# Patient Record
Sex: Male | Born: 1970 | Race: White | Hispanic: No | Marital: Married | Smoking: Never smoker
Health system: Southern US, Community
[De-identification: ages and names within clinical notes are randomized; demographics above are authoritative.]

## PROBLEM LIST (undated history)

## (undated) DIAGNOSIS — F32A Depression, unspecified: Secondary | ICD-10-CM

## (undated) DIAGNOSIS — E782 Mixed hyperlipidemia: Secondary | ICD-10-CM

## (undated) DIAGNOSIS — F419 Anxiety disorder, unspecified: Secondary | ICD-10-CM

## (undated) DIAGNOSIS — I10 Essential (primary) hypertension: Secondary | ICD-10-CM

## (undated) DIAGNOSIS — M545 Low back pain, unspecified: Secondary | ICD-10-CM

## (undated) DIAGNOSIS — F411 Generalized anxiety disorder: Secondary | ICD-10-CM

## (undated) DIAGNOSIS — F329 Major depressive disorder, single episode, unspecified: Secondary | ICD-10-CM

## (undated) DIAGNOSIS — F410 Panic disorder [episodic paroxysmal anxiety] without agoraphobia: Secondary | ICD-10-CM

## (undated) HISTORY — DX: Depression, unspecified: F32.A

## (undated) HISTORY — DX: Major depressive disorder, single episode, unspecified: F32.9

## (undated) HISTORY — DX: Panic disorder (episodic paroxysmal anxiety): F41.0

## (undated) HISTORY — DX: Low back pain: M54.5

## (undated) HISTORY — DX: Generalized anxiety disorder: F41.1

## (undated) HISTORY — DX: Anxiety disorder, unspecified: F41.9

## (undated) HISTORY — DX: Low back pain, unspecified: M54.50

## (undated) HISTORY — DX: Mixed hyperlipidemia: E78.2

---

## 2004-12-26 ENCOUNTER — Ambulatory Visit: Payer: Self-pay | Admitting: Family Medicine

## 2005-06-02 ENCOUNTER — Ambulatory Visit: Payer: Self-pay | Admitting: Family Medicine

## 2005-06-10 ENCOUNTER — Emergency Department (HOSPITAL_COMMUNITY): Admission: EM | Admit: 2005-06-10 | Discharge: 2005-06-10 | Payer: Self-pay | Admitting: Family Medicine

## 2005-07-22 ENCOUNTER — Ambulatory Visit: Payer: Self-pay | Admitting: Family Medicine

## 2005-08-23 ENCOUNTER — Emergency Department (HOSPITAL_COMMUNITY): Admission: EM | Admit: 2005-08-23 | Discharge: 2005-08-23 | Payer: Self-pay | Admitting: Family Medicine

## 2006-01-03 ENCOUNTER — Emergency Department (HOSPITAL_COMMUNITY): Admission: EM | Admit: 2006-01-03 | Discharge: 2006-01-03 | Payer: Self-pay | Admitting: Family Medicine

## 2006-08-16 ENCOUNTER — Emergency Department (HOSPITAL_COMMUNITY): Admission: EM | Admit: 2006-08-16 | Discharge: 2006-08-16 | Payer: Self-pay | Admitting: Emergency Medicine

## 2011-09-07 ENCOUNTER — Emergency Department (HOSPITAL_COMMUNITY)
Admission: EM | Admit: 2011-09-07 | Discharge: 2011-09-08 | Disposition: A | Discharge status: Home / Self Care | Payer: Managed Care, Other (non HMO) | Attending: Internal Medicine | Admitting: Internal Medicine

## 2011-09-07 ENCOUNTER — Encounter (HOSPITAL_COMMUNITY): Payer: Self-pay | Admitting: *Deleted

## 2011-09-07 DIAGNOSIS — F1120 Opioid dependence, uncomplicated: Secondary | ICD-10-CM | POA: Insufficient documentation

## 2011-09-07 LAB — CBC
HCT: 41 % (ref 39.0–52.0)
Hemoglobin: 14.6 g/dL (ref 13.0–17.0)
MCH: 30.7 pg (ref 26.0–34.0)
MCHC: 35.6 g/dL (ref 30.0–36.0)
Platelets: 343 10*3/uL (ref 150–400)
RBC: 4.75 MIL/uL (ref 4.22–5.81)

## 2011-09-07 LAB — COMPREHENSIVE METABOLIC PANEL
AST: 30 U/L (ref 0–37)
Albumin: 4.2 g/dL (ref 3.5–5.2)
BUN: 13 mg/dL (ref 6–23)
CO2: 24 mEq/L (ref 19–32)
Calcium: 9.2 mg/dL (ref 8.4–10.5)
Chloride: 102 mEq/L (ref 96–112)
GFR calc Af Amer: >90 mL/min (ref >90)
GFR calc non Af Amer: >90 mL/min (ref >90)
Glucose, Bld: 95 mg/dL (ref 70–99)
Potassium: 3.6 mEq/L (ref 3.5–5.1)
Sodium: 137 mEq/L (ref 135–145)
Total Bilirubin: 0.3 mg/dL (ref 0.3–1.2)
Total Protein: 7.7 g/dL (ref 6.0–8.3)

## 2011-09-07 LAB — RAPID URINE DRUG SCREEN, HOSP PERFORMED
Amphetamines: NOT DETECTED
Benzodiazepines: NOT DETECTED
Cocaine: NOT DETECTED
Opiates: POSITIVE — AB
Tetrahydrocannabinol: NOT DETECTED

## 2011-09-07 LAB — DIFFERENTIAL
Basophils Absolute: 0 10*3/uL (ref 0.0–0.1)
Basophils Relative: 0 % (ref 0–1)
Eosinophils Absolute: 0.1 10*3/uL (ref 0.0–0.7)
Eosinophils Relative: 1 % (ref 0–5)
Lymphs Abs: 1.7 10*3/uL (ref 0.7–4.0)
Monocytes Absolute: 0.9 10*3/uL (ref 0.1–1.0)
Monocytes Relative: 8 % (ref 3–12)
Neutro Abs: 8.1 10*3/uL — ABNORMAL HIGH (ref 1.7–7.7)
Neutrophils Relative %: 75 % (ref 43–77)

## 2011-09-07 MED ORDER — NICOTINE 21 MG/24HR TD PT24: 21.0000 mg | MEDICATED_PATCH | Freq: Every day | TRANSDERMAL | Status: DC

## 2011-09-07 MED ORDER — ZOLPIDEM TARTRATE 5 MG PO TABS
10.0000 mg | ORAL_TABLET | Freq: Every evening | ORAL | Status: DC | PRN
  Administered 2011-09-08: 10 mg via ORAL
  Filled 2011-09-07: qty 2

## 2011-09-07 MED ORDER — ONDANSETRON 4 MG PO TBDP
ORAL_TABLET | ORAL | Status: AC
  Administered 2011-09-07: 4 mg
  Filled 2011-09-07: qty 1

## 2011-09-07 MED ORDER — ONDANSETRON HCL 4 MG PO TABS
4.0000 mg | ORAL_TABLET | Freq: Three times a day (TID) | ORAL | Status: DC | PRN
  Administered 2011-09-08: 4 mg via ORAL
  Filled 2011-09-07: qty 1

## 2011-09-07 MED ORDER — ACETAMINOPHEN 325 MG PO TABS
650.0000 mg | ORAL_TABLET | ORAL | Status: DC | PRN
  Administered 2011-09-07: 650 mg via ORAL
  Filled 2011-09-07: qty 2

## 2011-09-07 MED ORDER — LORAZEPAM 1 MG PO TABS
1.0000 mg | ORAL_TABLET | ORAL | Status: DC | PRN
  Administered 2011-09-07 – 2011-09-08 (×4): 1 mg via ORAL
  Filled 2011-09-07 (×4): qty 1

## 2011-09-07 MED ORDER — ALUM & MAG HYDROXIDE-SIMETH 200-200-20 MG/5ML PO SUSP
30.0000 mL | ORAL | Status: DC | PRN
  Administered 2011-09-08: 30 mL via ORAL
  Filled 2011-09-07: qty 30

## 2011-09-07 MED ORDER — CLONIDINE HCL 0.1 MG PO TABS
0.1000 mg | ORAL_TABLET | Freq: Three times a day (TID) | ORAL | Status: DC
  Administered 2011-09-08 (×2): 0.1 mg via ORAL
  Filled 2011-09-07: qty 1

## 2011-09-07 MED ORDER — IBUPROFEN 200 MG PO TABS: 600.0000 mg | ORAL_TABLET | Freq: Three times a day (TID) | ORAL | Status: DC | PRN

## 2011-09-07 NOTE — ED Notes (Signed)
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 Usually takes 6 hydrocodone 10/750 daily.

## 2011-09-07 NOTE — ED Provider Notes (Signed)
History     CSN: [id number]  Arrival date & time 09/07/11  1730   First MD Initiated Contact with Patient 09/07/11 1739      Chief Complaint  Patient presents with  . Drug Problem    (Consider location/radiation/quality/duration/timing/severity/associated sxs/prior treatment) HPI  Patient relates he has been addicted to opiates for a few years. He states he quit on his own about a year ago and was sober about 2 months and restarted taking narcotics off the street. He states he's taking about 6 hydrocodone 10/750 a day. He states he took his last dose yesterday. He states today he is having myalgias, weakness, some headache, chills, and mild nausea. He states he has mild depression but he denies feeling suicidal or homicidal. He states he's never been formally admitted for detox or rehabilitation.  PCP none  History reviewed. No pertinent past medical history.  History reviewed. No pertinent past surgical history.  No family history on file.  History  Substance Use Topics  . Smoking status: Never Smoker   . Smokeless tobacco: Not on file  . Alcohol Use: No   employed Lives with spouse    Review of Systems  All other systems reviewed and are negative.    Allergies  Suboxone and Subutex  Home Medications  No current outpatient prescriptions on file.  BP 140/94  Pulse 107  Temp(Src) 98.2 F (36.8 C) (Oral)  Resp 19  Wt 195 lb (88.451 kg)  SpO2 100% Vital signs normal except for tachycardia   Physical Exam  Nursing note and vitals reviewed. Constitutional: He is oriented to person, place, and time. He appears well-developed and well-nourished.  Non-toxic appearance. He does not appear ill. No distress.  HENT:  Head: Normocephalic and atraumatic.  Right Ear: External ear normal.  Left Ear: External ear normal.  Nose: Nose normal. No mucosal edema or rhinorrhea.  Mouth/Throat: Oropharynx is clear and moist and mucous membranes are normal. No dental abscesses  or uvula swelling.  Eyes: Conjunctivae and EOM are normal. Pupils are equal, round, and reactive to light.  Neck: Normal range of motion and full passive range of motion without pain. Neck supple.  Cardiovascular: Normal rate, regular rhythm and normal heart sounds.  Exam reveals no gallop and no friction rub.   No murmur heard. Pulmonary/Chest: Effort normal and breath sounds normal. No respiratory distress. He has no wheezes. He has no rhonchi. He has no rales. He exhibits no tenderness and no crepitus.  Abdominal: Soft. Normal appearance and bowel sounds are normal. He exhibits no distension. There is no tenderness. There is no rebound and no guarding.  Musculoskeletal: Normal range of motion. He exhibits no edema and no tenderness.       Moves all extremities well.   Neurological: He is alert and oriented to person, place, and time. He has normal strength. No cranial nerve deficit.  Skin: Skin is warm, dry and intact. No rash noted. No erythema. No pallor.  Psychiatric: He has a normal mood and affect. His speech is normal and behavior is normal. His mood appears not anxious.    ED Course  Procedures (including critical care time) Psych holding orders written, waiting for ACT evaluation.   Results for orders placed during the hospital encounter of 09/07/11  URINE RAPID DRUG SCREEN (HOSP PERFORMED)      Component Value Range   Opiates POSITIVE (*) NONE DETECTED    Cocaine NONE DETECTED  NONE DETECTED    Benzodiazepines NONE DETECTED  NONE DETECTED    Amphetamines NONE DETECTED  NONE DETECTED    Tetrahydrocannabinol NONE DETECTED  NONE DETECTED    Barbiturates NONE DETECTED  NONE DETECTED   CBC      Component Value Range   WBC 10.8 (*) 4.0 - 10.5 (K/uL)   RBC 4.75  4.22 - 5.81 (MIL/uL)   Hemoglobin 14.6  13.0 - 17.0 (g/dL)   HCT 13.0  86.5 - 78.4 (%)   MCV 86.3  78.0 - 100.0 (fL)   MCH 30.7  26.0 - 34.0 (pg)   MCHC 35.6  30.0 - 36.0 (g/dL)   RDW 69.6  29.5 - 28.4 (%)    Platelets 343  150 - 400 (K/uL)  DIFFERENTIAL      Component Value Range   Neutrophils Relative 75  43 - 77 (%)   Neutro Abs 8.1 (*) 1.7 - 7.7 (K/uL)   Lymphocytes Relative 15  12 - 46 (%)   Lymphs Abs 1.7  0.7 - 4.0 (K/uL)   Monocytes Relative 8  3 - 12 (%)   Monocytes Absolute 0.9  0.1 - 1.0 (K/uL)   Eosinophils Relative 1  0 - 5 (%)   Eosinophils Absolute 0.1  0.0 - 0.7 (K/uL)   Basophils Relative 0  0 - 1 (%)   Basophils Absolute 0.0  0.0 - 0.1 (K/uL)  ETHANOL      Component Value Range   Alcohol, Ethyl (B) <11  0 - 11 (mg/dL)  COMPREHENSIVE METABOLIC PANEL      Component Value Range   Sodium 137  135 - 145 (mEq/L)   Potassium 3.6  3.5 - 5.1 (mEq/L)   Chloride 102  96 - 112 (mEq/L)   CO2 24  19 - 32 (mEq/L)   Glucose, Bld 95  70 - 99 (mg/dL)   BUN 13  6 - 23 (mg/dL)   Creatinine, Ser 1.32  0.50 - 1.35 (mg/dL)   Calcium 9.2  8.4 - 44.0 (mg/dL)   Total Protein 7.7  6.0 - 8.3 (g/dL)   Albumin 4.2  3.5 - 5.2 (g/dL)   AST 30  0 - 37 (U/L)   ALT 36  0 - 53 (U/L)   Alkaline Phosphatase 101  39 - 117 (U/L)   Total Bilirubin 0.3  0.3 - 1.2 (mg/dL)   GFR calc non Af Amer >90  >90 (mL/min)   GFR calc Af Amer >90  >90 (mL/min)   Laboratory interpretation all normal except for UDS   Diagnoses that have been ruled out:  None  Diagnoses that are still under consideration:  None  Final diagnoses:  Narcotic addiction   Plan evaluation by ACT and placement  Devoria Albe, MD, FACEP   MDM          Ward Givens, MD 09/08/11 [phone]

## 2011-09-07 NOTE — ED Notes (Signed)
Pt states "here for detox off pain meds, last dose taken was yesterday approx 1600, hydrocodone 10 mg, subaxone about killed me"

## 2011-09-07 NOTE — ED Notes (Signed)
Pt c/o headache, nausea and nervousness. Medicated per order.

## 2011-09-07 NOTE — ED Notes (Signed)
MD at bedside. 

## 2011-09-08 ENCOUNTER — Encounter (HOSPITAL_COMMUNITY): Payer: Self-pay | Admitting: *Deleted

## 2011-09-08 LAB — GLUCOSE, CAPILLARY: Glucose-Capillary: 97 mg/dL (ref 70–99)

## 2011-09-08 MED ORDER — CLONIDINE HCL 0.1 MG PO TABS: 0.1000 mg | ORAL_TABLET | Freq: Three times a day (TID) | ORAL | Status: DC | PRN

## 2011-09-08 NOTE — BH Assessment (Signed)
Patient referred and also declined at [facility]. Pt declined by Dr.Ikewechegh at [facility].  Informed by Fulton Mole in the assessment department that a bed is available and patient will be ran. Pt is under review at [facility] at this time.

## 2011-09-08 NOTE — BH Assessment (Signed)
Assessment Note   Noah Holland is a 41 y.o. male who presents to [facility] requesting detox from Opiates.  Pt is abusing opiates--hydrocodone and oxycodone,  was prescribed meds 2 yrs for kidney stones and has been abusing since that time, taking 6 pills daily.  Pt denies SI/HI/Psych and has had no prior tx for for SA.   Pt has no c/o w/d sxs but reports sxs earlier--chills, muscle aches, nausea; given meds for sxs.  Pt info sent to [facility] to review for inpt admission.    Axis I: Substance Abuse Axis II: Deferred Axis III: History reviewed. No pertinent past medical history. Axis IV: other psychosocial or environmental problems, problems related to social environment and problems with primary support group Axis V: 41-50 serious symptoms  Past Medical History: History reviewed. No pertinent past medical history.  History reviewed. No pertinent past surgical history.  Family History: No family history on file.  Social History:  reports that he has never smoked. He does not have any smokeless tobacco history on file. He reports that he uses illicit drugs (Hydrocodone and Oxycodone) about 42 times per week. He reports that he does not drink alcohol.  Additional Social History:  Alcohol / Drug Use Pain Medications: Opiates--hydrocodone, oxycodone  Prescriptions: None  Over the Counter: None  History of alcohol / drug use?: Yes Substance #1 Name of Substance 1: Opiates--Hydrocodone, Oxycodone  1 - Age of First Use: 38 YOM 1 - Amount (size/oz): 6 Pills  1 - Frequency: Daily  1 - Duration: 2 Yrs  1 - Last Use / Amount: 09/07/11 Allergies:  Allergies  Allergen Reactions  . Suboxone     Aching, vomiting, diarrhea, chills  . Subutex (Buprenorphine Hcl)     Aching, chills, vomiting    Home Medications:  Medications Prior to Admission  Medication Dose Route Frequency Provider Last Rate Last Dose  . acetaminophen (TYLENOL) tablet 650 mg  650 mg Oral Q4H PRN Ward Givens, MD   650 mg at  09/07/11 2018  . alum & mag hydroxide-simeth (MAALOX/MYLANTA) 200-200-20 MG/5ML suspension 30 mL  30 mL Oral PRN Ward Givens, MD   30 mL at 09/08/11 0019  . cloNIDine (CATAPRES) tablet 0.1 mg  0.1 mg Oral TID Ward Givens, MD      . ibuprofen (ADVIL,MOTRIN) tablet 600 mg  600 mg Oral Q8H PRN Ward Givens, MD      . LORazepam (ATIVAN) tablet 1 mg  1 mg Oral Q4H PRN Ward Givens, MD   1 mg at 09/08/11 0018  . nicotine (NICODERM CQ - dosed in mg/24 hours) patch 21 mg  21 mg Transdermal Daily Ward Givens, MD      . ondansetron (ZOFRAN) tablet 4 mg  4 mg Oral Q8H PRN Ward Givens, MD      . ondansetron (ZOFRAN-ODT) 4 MG disintegrating tablet        4 mg at 09/07/11 2020  . zolpidem (AMBIEN) tablet 10 mg  10 mg Oral QHS PRN Ward Givens, MD   10 mg at 09/08/11 0141   No current outpatient prescriptions on file as of 09/07/2011.    OB/GYN Status:  No LMP for male patient.  General Assessment Data Location of Assessment: [facility] ED Living Arrangements: Spouse/significant other Can pt return to current living arrangement?: Yes Admission Status: Voluntary Is patient capable of signing voluntary admission?: Yes Transfer from: Acute Hospital Referral Source: MD  Education Status Is patient currently in school?:  No Current Grade: None  Highest grade of school patient has completed: None  Name of school: None  Contact person: None   Risk to self Suicidal Ideation: No Suicidal Intent: No Is patient at risk for suicide?: No Suicidal Plan?: No Access to Means: No What has been your use of drugs/alcohol within the last 12 months?: Pt. currently abusing opiates  Previous Attempts/Gestures: No How many times?: 0  Other Self Harm Risks: None  Triggers for Past Attempts: None known Intentional Self Injurious Behavior: None Family Suicide History: No Recent stressful life event(s): Other (Comment) (Abusing opiates x34yrs ) Depression: No Depression Symptoms:  (None ) Substance abuse history and/or  treatment for substance abuse?: Yes Suicide prevention information given to non-admitted patients: Not applicable  Risk to Others Homicidal Ideation: No Thoughts of Harm to Others: No Current Homicidal Intent: No Current Homicidal Plan: No Access to Homicidal Means: No Identified Victim: None  History of harm to others?: No Assessment of Violence: None Noted Violent Behavior Description: None  Does patient have access to weapons?: No Criminal Charges Pending?: No Does patient have a court date: No  Psychosis Hallucinations: None noted Delusions: None noted  Mental Status Report Appear/Hygiene: Improved Eye Contact: Good Motor Activity: Unremarkable Speech: Logical/coherent Level of Consciousness: Alert Mood: Sad Affect: Sad Anxiety Level: None Thought Processes: Coherent;Relevant Judgement: Unimpaired Orientation: Person;Place;Time;Situation Obsessive Compulsive Thoughts/Behaviors: None  Cognitive Functioning Concentration: Normal Memory: Recent Intact;Remote Intact IQ: Average Insight: Fair Impulse Control: Fair Appetite: Good Weight Loss: 0  Weight Gain: 0  Sleep: No Change Total Hours of Sleep: 8  Vegetative Symptoms: None  Prior Inpatient Therapy Prior Inpatient Therapy: No Prior Therapy Dates: None  Prior Therapy Facilty/Provider(s): None  Reason for Treatment: None   Prior Outpatient Therapy Prior Outpatient Therapy: No Prior Therapy Dates: None  Prior Therapy Facilty/Provider(s): None  Reason for Treatment: None   ADL Screening (condition at time of admission) Patient's cognitive ability adequate to safely complete daily activities?: Yes Patient able to express need for assistance with ADLs?: Yes Independently performs ADLs?: Yes Weakness of Legs: None Weakness of Arms/Hands: None       Abuse/Neglect Assessment (Assessment to be complete while patient is alone) Physical Abuse: Denies Verbal Abuse: Denies Sexual Abuse:  Denies Exploitation of patient/patient's resources: Denies Self-Neglect: Denies Values / Beliefs Cultural Requests During Hospitalization: None Spiritual Requests During Hospitalization: None Consults Spiritual Care Consult Needed: No Social Work Consult Needed: No Merchant navy officer (For Healthcare) Advance Directive: Patient does not have advance directive;Patient would not like information Pre-existing out of facility DNR order (yellow form or pink MOST form): No    Additional Information 1:1 In Past 12 Months?: No CIRT Risk: No Elopement Risk: No Does patient have medical clearance?: Yes     Disposition:  Disposition Disposition of Patient: Inpatient treatment program;Referred to [facility] for review ) Type of inpatient treatment program: Adult Patient referred to: Other (Comment) [facility] for review )  On Site Evaluation by:   Reviewed with Physician:     Murrell Redden 09/08/2011 5:43 AM

## 2011-09-08 NOTE — ED Notes (Signed)
Patient is resting comfortably. 

## 2011-09-08 NOTE — ED Notes (Signed)
Pt anxious stated he thinks he is having a" panic attack"" Ativan given

## 2011-09-08 NOTE — BH Assessment (Signed)
Patient referred to [facility] . Confirmed with staff-Lynn at [facility] that they do have available beds. Writer will follow up with patients disposition.

## 2011-09-08 NOTE — ED Provider Notes (Signed)
Patient is awaiting placement for opiate detoxification. He is currently sleeping and with no complaints.  Noah Booze, MD 09/08/11 [phone]

## 2011-09-08 NOTE — Progress Notes (Signed)
ED CM approached by family to inquire about ativan and BH d/c plans.  CM spoke with Jessie Foot ACT team member to discuss inquiry about [facility] d/c plans.  Will see pt

## 2011-09-08 NOTE — ED Notes (Signed)
Pt wants to sign out AMA Dr. Alto Denver aware

## 2011-09-08 NOTE — ED Notes (Signed)
Pt brought over from ED to and placed in room 28 with family at made comfortable.

## 2011-09-08 NOTE — ED Provider Notes (Signed)
Patient no longer wanted to stay for detox. He also was turned down by [facility] which was the last facility pending. Patient did have improvement with use of clonidine. He was discharged with a prescription for this. Family was present for discharge were comfortable with plan. Patient was discharged in good condition.  Cyndra Numbers, MD 09/08/11 1949

## 2011-09-08 NOTE — ED Notes (Addendum)
Pt  Anxious c/o nausea meds given see mar

## 2011-09-08 NOTE — ED Notes (Signed)
Pt wanted to sign out AMA DR Huntt made aware

## 2011-09-08 NOTE — ED Notes (Signed)
No distress.  Family is at the bedside.  Belongings returned to pt.

## 2014-02-26 ENCOUNTER — Emergency Department (HOSPITAL_COMMUNITY)
Admission: EM | Admit: 2014-02-26 | Discharge: 2014-02-26 | Disposition: A | Discharge status: Home / Self Care | Payer: Managed Care, Other (non HMO) | Attending: Emergency Medicine | Admitting: Emergency Medicine

## 2014-02-26 ENCOUNTER — Encounter (HOSPITAL_COMMUNITY): Payer: Self-pay | Admitting: Emergency Medicine

## 2014-02-26 DIAGNOSIS — R109 Unspecified abdominal pain: Secondary | ICD-10-CM

## 2014-02-26 DIAGNOSIS — Z79899 Other long term (current) drug therapy: Secondary | ICD-10-CM | POA: Insufficient documentation

## 2014-02-26 DIAGNOSIS — R319 Hematuria, unspecified: Secondary | ICD-10-CM | POA: Insufficient documentation

## 2014-02-26 DIAGNOSIS — I10 Essential (primary) hypertension: Secondary | ICD-10-CM | POA: Insufficient documentation

## 2014-02-26 DIAGNOSIS — R110 Nausea: Secondary | ICD-10-CM | POA: Insufficient documentation

## 2014-02-26 HISTORY — DX: Essential (primary) hypertension: I10

## 2014-02-26 LAB — URINE MICROSCOPIC-ADD ON

## 2014-02-26 LAB — I-STAT CHEM 8, ED
BUN: 10 mg/dL (ref 6–23)
Calcium, Ion: 1.24 mmol/L — ABNORMAL HIGH (ref 1.12–1.23)
Chloride: 105 mEq/L (ref 96–112)
Creatinine, Ser: 0.8 mg/dL (ref 0.50–1.35)
Glucose, Bld: 125 mg/dL — ABNORMAL HIGH (ref 70–99)
HEMATOCRIT: 45 % (ref 39.0–52.0)
HEMOGLOBIN: 15.3 g/dL (ref 13.0–17.0)
POTASSIUM: 3.9 meq/L (ref 3.7–5.3)
SODIUM: 141 meq/L (ref 137–147)
TCO2: 23 mmol/L (ref 0–100)

## 2014-02-26 LAB — URINALYSIS, ROUTINE W REFLEX MICROSCOPIC
BILIRUBIN URINE: NEGATIVE
GLUCOSE, UA: NEGATIVE mg/dL
KETONES UR: NEGATIVE mg/dL
NITRITE: NEGATIVE
PH: 5.5 (ref 5.0–8.0)
Protein, ur: 30 mg/dL — AB
Specific Gravity, Urine: 1.021 (ref 1.005–1.030)
Urobilinogen, UA: 0.2 mg/dL (ref 0.0–1.0)

## 2014-02-26 MED ORDER — CLONIDINE HCL 0.1 MG PO TABS
0.1000 mg | ORAL_TABLET | Freq: Once | ORAL | Status: AC
  Administered 2014-02-26: 0.1 mg via ORAL
  Filled 2014-02-26: qty 1

## 2014-02-26 MED ORDER — OXYCODONE-ACETAMINOPHEN 5-325 MG PO TABS: 1.0000 | ORAL_TABLET | Freq: Three times a day (TID) | ORAL | Status: DC | PRN

## 2014-02-26 MED ORDER — LORAZEPAM 1 MG PO TABS
1.0000 mg | ORAL_TABLET | Freq: Once | ORAL | Status: AC
  Administered 2014-02-26: 1 mg via ORAL
  Filled 2014-02-26: qty 1

## 2014-02-26 MED ORDER — ONDANSETRON 4 MG PO TBDP
4.0000 mg | ORAL_TABLET | Freq: Once | ORAL | Status: AC
  Administered 2014-02-26: 4 mg via ORAL
  Filled 2014-02-26: qty 1

## 2014-02-26 MED ORDER — OXYCODONE-ACETAMINOPHEN 5-325 MG PO TABS
1.0000 | ORAL_TABLET | Freq: Once | ORAL | Status: AC
  Administered 2014-02-26: 1 via ORAL
  Filled 2014-02-26: qty 1

## 2014-02-26 MED ORDER — ONDANSETRON HCL 4 MG PO TABS: 4.0000 mg | ORAL_TABLET | Freq: Four times a day (QID) | ORAL | Status: DC

## 2014-02-26 NOTE — Discharge Instructions (Signed)
Ureteral Colic Ureteral colic is spasm-like pain from the kidney or the ureter. This is often caused by a kidney stone. The pain is caused by the stone trying to get through the tubes that pass your pee. HOME CARE   Drink enough fluids to keep your pee (urine) clear or pale yellow.  Strain all your pee. A strainer will be provided. Keep anything caught in the strainer and bring it to your doctor. The stone causing the pain may be very small.  Only take medicine as told by your doctor.  Follow up with your doctor as told. GET HELP RIGHT AWAY IF:   Pain is not controlled with medicine.  Pain continues or gets worse.  The pain changes and there is chest or belly (abdominal) pain.  You pass out (faint).  You cannot pee.  You keep throwing up (vomiting).  You have a temperature by mouth above 102 F (38.9 C), not controlled by medicine. MAKE SURE YOU:   Understand these instructions.  Will watch this condition.  Will get help right away if you are not doing well or get worse. Document Released: 01/27/2008 Document Revised: 11/02/2011 Document Reviewed: 01/27/2008 ExitCare Patient Information 2015 [employer]. This information is not intended to replace advice given to you by your health care provider. Make sure you discuss any questions you have with your health care provider.  

## 2014-02-26 NOTE — ED Notes (Signed)
Per pt sts he has been have left sided flank pain x 1 week. Hx of kidney stones. sts also some hematuria

## 2014-02-26 NOTE — ED Provider Notes (Signed)
CSN: [id number]     Arrival date & time 02/26/14  1212 History   First MD Initiated Contact with Patient 02/26/14 1430     Chief Complaint  Patient presents with  . Flank Pain   HPI Comments: Patient has known kidney stones on the left and is followed by Dr. Salvatore Decentaberwal from [facility].  Patient has an appointment on 02/28/14 by his report.  Patient is a 43 y.o. male presenting with flank pain. The history is provided by the patient. No language interpreter was used.  Flank Pain This is a new problem. The current episode started yesterday. The problem occurs intermittently. The problem has been gradually worsening. Associated symptoms include nausea. Pertinent negatives include no abdominal pain, anorexia, arthralgias, change in bowel habit, chest pain, chills, congestion, coughing, diaphoresis, fatigue, fever, headaches, joint swelling, myalgias, neck pain, numbness, rash, sore throat, swollen glands, urinary symptoms, vertigo, visual change, vomiting or weakness. The symptoms are aggravated by walking, standing and exertion. He has tried acetaminophen, NSAIDs, position changes and rest for the symptoms. The treatment provided no relief.    Past Medical History  Diagnosis Date  . Hypertension    History reviewed. No pertinent past surgical history. History reviewed. No pertinent family history. History  Substance Use Topics  . Smoking status: Never Smoker   . Smokeless tobacco: Not on file  . Alcohol Use: No    Review of Systems  Constitutional: Negative for fever, chills, diaphoresis and fatigue.  HENT: Negative for congestion and sore throat.   Respiratory: Negative for cough.   Cardiovascular: Negative for chest pain.  Gastrointestinal: Positive for nausea. Negative for vomiting, abdominal pain, anorexia and change in bowel habit.  Genitourinary: Positive for hematuria and flank pain. Negative for dysuria, urgency, decreased urine volume, scrotal swelling, difficulty urinating,  penile pain and testicular pain.  Musculoskeletal: Negative for arthralgias, joint swelling, myalgias and neck pain.  Skin: Negative for rash.  Neurological: Negative for vertigo, weakness, numbness and headaches.  All other systems reviewed and are negative.     Allergies  Buprenorphine hcl-naloxone hcl and Subutex  Home Medications   Prior to Admission medications   Medication Sig Start Date End Date Taking? Authorizing Provider  LORazepam (ATIVAN) 1 MG tablet Take 1 mg by mouth every 8 (eight) hours as needed for anxiety.   Yes Historical Provider, MD  PARoxetine (PAXIL) 40 MG tablet Take 40 mg by mouth every morning.   Yes Historical Provider, MD  triamterene-hydrochlorothiazide (MAXZIDE-25) 37.5-25 MG per tablet Take 1 tablet by mouth daily.   Yes Historical Provider, MD  ondansetron (ZOFRAN) 4 MG tablet Take 1 tablet (4 mg total) by mouth every 6 (six) hours. 02/26/14   Yuriy Cui A Forcucci, PA-C  oxyCODONE-acetaminophen (PERCOCET/ROXICET) 5-325 MG per tablet Take 1 tablet by mouth every 8 (eight) hours as needed for moderate pain or severe pain. 02/26/14   Nykolas Bacallao A Forcucci, PA-C   BP 155/100  Pulse 102  Temp(Src) 98.3 F (36.8 C) (Oral)  Resp 16  SpO2 98% Physical Exam  Nursing note and vitals reviewed. Constitutional: He is oriented to person, place, and time. He appears well-developed and well-nourished. No distress.  HENT:  Head: Normocephalic and atraumatic.  Mouth/Throat: Oropharynx is clear and moist. No oropharyngeal exudate.  Eyes: Conjunctivae and EOM are normal. Pupils are equal, round, and reactive to light. No scleral icterus.  Neck: Normal range of motion. Neck supple. No JVD present. No thyromegaly present.  Cardiovascular: Normal rate, regular rhythm, normal heart sounds and  intact distal pulses.  Exam reveals no gallop and no friction rub.   No murmur heard. Pulmonary/Chest: Effort normal and breath sounds normal. No respiratory distress. He has no wheezes.  He has no rales. He exhibits no tenderness.  Abdominal: Soft. Normal appearance and bowel sounds are normal. He exhibits no distension and no mass. There is no tenderness. There is CVA tenderness. There is no rebound and no guarding.  Left CVA tenderness  Musculoskeletal: Normal range of motion.  Lymphadenopathy:    He has no cervical adenopathy.  Neurological: He is alert and oriented to person, place, and time.  Skin: Skin is warm and dry. He is not diaphoretic.  Psychiatric: He has a normal mood and affect. His behavior is normal. Judgment and thought content normal.    ED Course  Procedures (including critical care time) Labs Review Labs Reviewed  URINALYSIS, ROUTINE W REFLEX MICROSCOPIC - Abnormal; Notable for the following:    Hgb urine dipstick LARGE (*)    Protein, ur 30 (*)    Leukocytes, UA TRACE (*)    All other components within normal limits  URINE MICROSCOPIC-ADD ON - Abnormal; Notable for the following:    Bacteria, UA FEW (*)    All other components within normal limits  I-STAT CHEM 8, ED - Abnormal; Notable for the following:    Glucose, Bld 125 (*)    Calcium, Ion 1.24 (*)    All other components within normal limits    Imaging Review No results found.   EKG Interpretation None      MDM   Final diagnoses:  Left flank pain   Patient is a 43 y.o. Male who presents to the ED for left flank pain.  Patient has known left kidney stones that were found on CT scan in [place].  Patient was supposed to receive lithotripsy, but backed out.  Patient does have large hgb in urine here and has normal creatinine.  Pain was well controlled with oral medications here.  Will discharge home with oxycodone and zofran for symptom relief.  He is stable for discharge at this time.  Patient already has a follow-up appointment with urology on 02/28/14.  He was told to return for worsening pain not controlled by oral narcotics, or difficulty urinating.  He states understanding and  agreement with the above plan.      Eben Burowourtney A Forcucci, PA-C 02/26/14 1656

## 2014-02-28 NOTE — ED Provider Notes (Signed)
Medical screening examination/treatment/procedure(s) were performed by non-physician practitioner and as supervising physician I was immediately available for consultation/collaboration.   EKG Interpretation None        Gwyneth SproutWhitney Tora Prunty, MD 02/28/14 1439

## 2015-12-12 ENCOUNTER — Ambulatory Visit (INDEPENDENT_AMBULATORY_CARE_PROVIDER_SITE_OTHER): Payer: 59 | Admitting: Neurology

## 2015-12-12 ENCOUNTER — Encounter: Payer: Self-pay | Admitting: Neurology

## 2015-12-12 VITALS — BP 140/92 | HR 96 | Resp 20 | Ht 69.0 in | Wt 209.0 lb

## 2015-12-12 DIAGNOSIS — R51 Headache: Secondary | ICD-10-CM

## 2015-12-12 DIAGNOSIS — F419 Anxiety disorder, unspecified: Secondary | ICD-10-CM

## 2015-12-12 DIAGNOSIS — G4730 Sleep apnea, unspecified: Secondary | ICD-10-CM | POA: Diagnosis not present

## 2015-12-12 DIAGNOSIS — I10 Essential (primary) hypertension: Secondary | ICD-10-CM

## 2015-12-12 DIAGNOSIS — F5105 Insomnia due to other mental disorder: Secondary | ICD-10-CM

## 2015-12-12 DIAGNOSIS — R519 Headache, unspecified: Secondary | ICD-10-CM

## 2015-12-12 DIAGNOSIS — R0683 Snoring: Secondary | ICD-10-CM | POA: Diagnosis not present

## 2015-12-12 NOTE — Patient Instructions (Signed)

## 2015-12-12 NOTE — Progress Notes (Signed)
SLEEP MEDICINE CLINIC   Provider:  Melvyn Novasarmen  Malon Siddall, M D  Referring Provider: Patrick Jupiterox, Erica A, NP Primary Care Physician:    Chief Complaint  Patient presents with  . New Patient (Initial Visit)    snores, never had sleep study, rm 10, alone    HPI:  Noah Holland is a [age] y.o. male , seen here as a referral from NP Alcario DroughtErica Cox for a sleep evaluation,   Chief complaint according to patient : Noah Holland states that his wife has noticed him to snore and that he has high blood pressures in the morning.   Over the last 3 months he has developed trouble to fall asleep.  NP Cox  has also mentioned that the patient suffers from a generalized anxiety disorder, essential hypertension, mixed hyperlipidemia, back pain, episodic paroxysmal anxiety-panic attacks, fatigue and impaired fasting glucose. The patient used to take clonazepam which was recently changed to Ativan. He can use 1 at bedtime as needed.  Noah Holland has made the correlation that since he has woken up gasping for air or choking in his sleep he is afraid of going to sleep. The patient works regular day shifts, and he has daylight exposure. He is married and shares a bedroom with his wife.   Sleep habits are as follows: He goes to bed around 9 PM and recently had trouble initiating sleep, usually took him about 30 minutes to fall asleep but now he needs medication. He wakes up frequently now, he wakes up about every hour and is not sure what leads to these arousals. He has 2 or 3 times caught himself gasping for air, feeling choked. The bedroom is described as cool, quiet and dark. The couple has a ceiling fan running in the background. Some evening is he will watch TV in his bedroom but this is not a routine. He usually switches the TV off before going to bed. He has no nocturnal urge to use the bathroom. Sleeps on 3 pillows and prefers the lateral sleep position. His wife reports that he is very restless frequently moving at night. She  has heard him sleep talking. Some nights he feels very cold and sometimes has woken shivering. He does not have nocturnal chest pains, palpitations or diaphoresis. He rises at about 5 AM and relies on an alarm. He often wakes nauseated, frequently experiences  morning headaches and sometimes has a dry mouth. He does not feel refreshed or restored in the morning, and is suffering from fragmented sleep. He estimates that he gets about 5 hours of nocturnal sleep. He cannot take naps during the day due to his workplace arrangements, but he would feel the need or the urge to nap occasionally.   Sleep medical history and family sleep history: The patient has no family member diagnosed with a sleep disorder, he was not a sleep walker, and did not experience night terrors. No enuresis. Social history: married, 2 sons, age 47 and 4622.  Review of Systems: Out of a complete 14 system review, the patient complains of only the following symptoms, and all other reviewed systems are negative. Snoring, sleep choking, morning headaches, frequent sleep arousals,  How likely are you to doze in the following situations: 0 = not likely, 1 = slight chance, 2 = moderate chance, 3 = high chance  Sitting and Reading? Watching Television? Sitting inactive in a public place (theater or meeting)? Lying down in the afternoon when circumstances permit? Sitting and talking to someone? Sitting quietly  after lunch without alcohol? In a car, while stopped for a few minutes in traffic? As a passenger in a car for an hour without a break?  Total = 11   Fatigue severity score 45 ,   Depression score 5   Social History   Social History  . Marital Status: Married    Spouse Name: N/A  . Number of Children: N/A  . Years of Education: N/A   Occupational History  . Not on file.   Social History Main Topics  . Smoking status: Never Smoker   . Smokeless tobacco: Not on file  . Alcohol Use: No  . Drug Use: No  .  Sexual Activity: Not on file   Other Topics Concern  . Not on file   Social History Narrative   Drinks lots of caffeine.    Family History  Problem Relation Age of Onset  . Kidney disease Mother   . Cancer Mother   . Cancer Father     Past Medical History  Diagnosis Date  . Hypertension   . Depression   . Anxiety   . Panic attack   . Low back pain   . GAD (generalized anxiety disorder)   . Mixed hyperlipidemia     History reviewed. No pertinent past surgical history.  Current Outpatient Prescriptions  Medication Sig Dispense Refill  . atorvastatin (LIPITOR) 20 MG tablet Take 20 mg by mouth daily.    . Cholecalciferol (VITAMIN D3) 5000 units CAPS Take 5,000 Units by mouth.    Marland Kitchen LORazepam (ATIVAN) 1 MG tablet Take 1 mg by mouth every 8 (eight) hours as needed for anxiety.    Marland Kitchen losartan (COZAAR) 100 MG tablet Take 100 mg by mouth daily.    Marland Kitchen PARoxetine (PAXIL-CR) 25 MG 24 hr tablet Take 25 mg by mouth daily.     No current facility-administered medications for this visit.    Allergies as of 12/12/2015 - Review Complete 12/12/2015  Allergen Reaction Noted  . Buprenorphine hcl-naloxone hcl  09/07/2011  . Subutex [buprenorphine hcl]  09/07/2011    Vitals: BP 140/92 mmHg  Pulse 96  Resp 20  Ht  (1.753 m)  Wt 209 lb (94.802 kg)  BMI 30.85 kg/m2 Last Weight:  Wt Readings from Last 1 Encounters:  12/12/15 209 lb (94.802 kg)   WJX:BJYN mass index is 30.85 kg/(m^2).     Last Height:   Ht Readings from Last 1 Encounters:  12/12/15  (1.753 m)    Physical exam:  General: The patient is awake, alert and appears not in acute distress. The patient is well groomed. Head: Normocephalic, atraumatic. Neck is supple. Mallampati 3  neck circumference:17.5 . Nasal airflow unrestricted, septal deviation,  Retrognathia is not seen.  Cardiovascular:  Regular rate and rhythm, without  murmurs or carotid bruit, and without distended neck veins. Respiratory: Lungs are  clear to auscultation. Skin:  Without evidence of edema, or rash Trunk: BMI is elevated . The patient's posture is erect   Neurologic exam : The patient is awake and alert, oriented to place and time.   Memory subjective  described as intact.   Attention span & concentration ability appears normal.  Speech is fluent,  without  dysarthria, dysphonia or aphasia.  Mood and affect are appropriate.  Cranial nerves: Pupils are equal and briskly reactive to light. Funduscopic exam without evidence of pallor or edema. Extraocular movements  in vertical and horizontal planes intact and without nystagmus. Visual fields by finger perimetry  are intact. Hearing to finger rub intact.   Facial sensation intact to fine touch.  Facial motor strength is symmetric and tongue and uvula move midline. Shoulder shrug was symmetrical.   Motor exam:   Normal tone, muscle bulk and symmetric strength in all extremities. Sensory:  Fine touch, pinprick and vibration were tested in all extremities. Proprioception tested in the upper extremities was normal. Coordination: Rapid alternating movements in the fingers/hands was normal. Finger-to-nose maneuver  normal without evidence of ataxia, dysmetria or tremor.  Gait and station: Patient walks without assistive device and is able unassisted to climb up to the exam table. Strength within normal limits.  Stance is stable and normal.  Deep tendon reflexes: in the  upper and lower extremities are symmetric and intact. Babinski maneuver response is downgoing.  The patient was advised of the nature of the diagnosed sleep disorder , the treatment options and risks for general a health and wellness arising from not treating the condition.  I spent more than 45 minutes of face to face time with the patient. Greater than 50% of time was spent in counseling and coordination of care. We have discussed the diagnosis and differential and I answered the patient's questions.     Dear  Alcario Drought,   I am thankful for your referral, call me if you need any questions answered.   Assessment:  After physical and neurologic examination, review of laboratory studies,  Personal review of imaging studies, reports of other /same  Imaging studies ,  Results of polysomnography/ neurophysiology testing and pre-existing records as far as provided in visit., my assessment is   1) Noah Holland has suffered from fragmented sleep partially due to anxiety, and partially due to experiencing choking and gasping for air at night. He knows that he is a snorer and it is very likely that he has indeed obstructive sleep apnea. I'm also concerned that he doesn't get enough sleep, sleeping only about 5 hours at night and feeling excessively daytime sleepy. That he wakes up with headaches, dry mouth and sometimes shivering may also be symptoms of apnea related hypercapnia or hypoxemia. For this reason I would like for him to have an attended sleep study including capnography. I know that his insurance heavily favors home sleep tests, but his symptom complex should warrant a better look.  Insomnia advise , sleep hygiene, SPLIT night with Co2.       Noah Mylar Borna Wessinger MD  12/12/2015   CC: Patrick Jupiter, Np [address] [place], [place] [address]

## 2015-12-17 ENCOUNTER — Encounter: Payer: Self-pay | Admitting: *Deleted

## 2016-02-09 ENCOUNTER — Ambulatory Visit (INDEPENDENT_AMBULATORY_CARE_PROVIDER_SITE_OTHER): Payer: 59 | Admitting: Neurology

## 2016-02-09 DIAGNOSIS — R51 Headache: Secondary | ICD-10-CM

## 2016-02-09 DIAGNOSIS — F5105 Insomnia due to other mental disorder: Secondary | ICD-10-CM

## 2016-02-09 DIAGNOSIS — R0683 Snoring: Secondary | ICD-10-CM

## 2016-02-09 DIAGNOSIS — G4730 Sleep apnea, unspecified: Secondary | ICD-10-CM | POA: Diagnosis not present

## 2016-02-09 DIAGNOSIS — F419 Anxiety disorder, unspecified: Secondary | ICD-10-CM | POA: Diagnosis not present

## 2016-02-09 DIAGNOSIS — I10 Essential (primary) hypertension: Secondary | ICD-10-CM | POA: Diagnosis not present

## 2016-02-09 DIAGNOSIS — R519 Headache, unspecified: Secondary | ICD-10-CM

## 2016-02-17 ENCOUNTER — Telehealth: Payer: Self-pay

## 2016-02-17 NOTE — Telephone Encounter (Signed)
I called pt to discuss sleep study results. No answer, left a message asking him to call me back.    
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
## 2016-06-15 ENCOUNTER — Ambulatory Visit: Payer: 59 | Admitting: Podiatry

## 2016-06-17 ENCOUNTER — Ambulatory Visit: Payer: 59 | Admitting: Sports Medicine

## 2016-06-24 ENCOUNTER — Ambulatory Visit: Payer: 59 | Admitting: Podiatry

## 2019-11-26 ENCOUNTER — Other Ambulatory Visit: Payer: Self-pay

## 2019-11-26 ENCOUNTER — Inpatient Hospital Stay (HOSPITAL_COMMUNITY)
Admission: EM | Admit: 2019-11-26 | Discharge: 2019-11-29 | DRG: 917 | Disposition: A | Discharge status: Home / Self Care | Payer: Self-pay | Attending: Internal Medicine | Admitting: Internal Medicine

## 2019-11-26 ENCOUNTER — Encounter (HOSPITAL_COMMUNITY): Payer: Self-pay

## 2019-11-26 ENCOUNTER — Emergency Department (HOSPITAL_COMMUNITY): Payer: Managed Care, Other (non HMO)

## 2019-11-26 DIAGNOSIS — F411 Generalized anxiety disorder: Secondary | ICD-10-CM | POA: Diagnosis present

## 2019-11-26 DIAGNOSIS — T465X4A Poisoning by other antihypertensive drugs, undetermined, initial encounter: Secondary | ICD-10-CM

## 2019-11-26 DIAGNOSIS — R001 Bradycardia, unspecified: Secondary | ICD-10-CM | POA: Diagnosis present

## 2019-11-26 DIAGNOSIS — I444 Left anterior fascicular block: Secondary | ICD-10-CM | POA: Diagnosis present

## 2019-11-26 DIAGNOSIS — T50901A Poisoning by unspecified drugs, medicaments and biological substances, accidental (unintentional), initial encounter: Secondary | ICD-10-CM | POA: Diagnosis present

## 2019-11-26 DIAGNOSIS — E782 Mixed hyperlipidemia: Secondary | ICD-10-CM | POA: Diagnosis present

## 2019-11-26 DIAGNOSIS — Z79899 Other long term (current) drug therapy: Secondary | ICD-10-CM

## 2019-11-26 DIAGNOSIS — Z915 Personal history of self-harm: Secondary | ICD-10-CM

## 2019-11-26 DIAGNOSIS — F1110 Opioid abuse, uncomplicated: Secondary | ICD-10-CM | POA: Diagnosis present

## 2019-11-26 DIAGNOSIS — U071 COVID-19: Secondary | ICD-10-CM | POA: Diagnosis present

## 2019-11-26 DIAGNOSIS — F410 Panic disorder [episodic paroxysmal anxiety] without agoraphobia: Secondary | ICD-10-CM | POA: Diagnosis present

## 2019-11-26 DIAGNOSIS — Y929 Unspecified place or not applicable: Secondary | ICD-10-CM

## 2019-11-26 DIAGNOSIS — I10 Essential (primary) hypertension: Secondary | ICD-10-CM | POA: Diagnosis present

## 2019-11-26 DIAGNOSIS — F329 Major depressive disorder, single episode, unspecified: Secondary | ICD-10-CM | POA: Diagnosis present

## 2019-11-26 DIAGNOSIS — Z888 Allergy status to other drugs, medicaments and biological substances status: Secondary | ICD-10-CM

## 2019-11-26 DIAGNOSIS — Z841 Family history of disorders of kidney and ureter: Secondary | ICD-10-CM

## 2019-11-26 DIAGNOSIS — T465X1A Poisoning by other antihypertensive drugs, accidental (unintentional), initial encounter: Principal | ICD-10-CM | POA: Diagnosis present

## 2019-11-26 LAB — CBC WITH DIFFERENTIAL/PLATELET
Abs Immature Granulocytes: 0.1 10*3/uL — ABNORMAL HIGH (ref 0.00–0.07)
Basophils Absolute: 0.1 10*3/uL (ref 0.0–0.1)
Basophils Relative: 0 %
Eosinophils Absolute: 0.1 10*3/uL (ref 0.0–0.5)
Eosinophils Relative: 1 %
HCT: 43.2 % (ref 39.0–52.0)
Hemoglobin: 14.7 g/dL (ref 13.0–17.0)
Immature Granulocytes: 1 %
Lymphocytes Relative: 9 %
Lymphs Abs: 1.6 10*3/uL (ref 0.7–4.0)
MCH: 29.3 pg (ref 26.0–34.0)
MCHC: 34 g/dL (ref 30.0–36.0)
MCV: 86.2 fL (ref 80.0–100.0)
Monocytes Absolute: 0.8 10*3/uL (ref 0.1–1.0)
Monocytes Relative: 5 %
Neutro Abs: 14.3 10*3/uL — ABNORMAL HIGH (ref 1.7–7.7)
Neutrophils Relative %: 84 %
Platelets: 353 10*3/uL (ref 150–400)
RBC: 5.01 MIL/uL (ref 4.22–5.81)
RDW: 13.4 % (ref 11.5–15.5)
WBC: 17 10*3/uL — ABNORMAL HIGH (ref 4.0–10.5)
nRBC: 0 % (ref 0.0–0.2)

## 2019-11-26 LAB — ETHANOL: Alcohol, Ethyl (B): <10 mg/dL (ref <10)

## 2019-11-26 LAB — ACETAMINOPHEN LEVEL: Acetaminophen (Tylenol), Serum: <10 ug/mL — ABNORMAL LOW (ref 10–30)

## 2019-11-26 LAB — SALICYLATE LEVEL: Salicylate Lvl: <7 mg/dL — ABNORMAL LOW (ref 7.0–30.0)

## 2019-11-26 MED ORDER — SODIUM CHLORIDE 0.9 % IV BOLUS
1000.0000 mL | Freq: Once | INTRAVENOUS | Status: AC
  Administered 2019-11-26: 1000 mL via INTRAVENOUS

## 2019-11-26 MED ORDER — SODIUM CHLORIDE 0.9 % IV SOLN: Freq: Once | INTRAVENOUS | Status: DC

## 2019-11-26 NOTE — ED Notes (Signed)
BP right arm 165/103, BP left arm 172/107.

## 2019-11-26 NOTE — ED Triage Notes (Signed)
Pt arrives via [facility] from home c/o drug overdose on clonidine. Pt advises of uncontrolled HTN all day, has taken approximately 20 0.1mg  clonidine within the last 12 hours. Pt denies SI/HI. VS: BP 186/117, HR  54 (SB), SPO2 97% 3/L Harlan, RR 16. Pt also advises of shearing abdominal pain radiating through back and left flank. GCS 15, A&Ox4.

## 2019-11-26 NOTE — ED Provider Notes (Signed)
[facility] EMERGENCY DEPARTMENT Provider Note   CSN: [id number] Arrival date & time: 11/26/19  2223     History Chief Complaint  Patient presents with   Drug Overdose    Noah Holland is a 49 y.o. male.   49 year old male with history of HTN, HLD, anxiety and depression, opiate abuse presents to the emergency department following an overdose.  He states that he took approximately 20 tablets of 0.1 mg clonidine over the past 24 hours.  Triage note references ingestion over the past 12 hours, however.  He does report that he last took 5 tablets of the total 20 pills 3 to 4 hours ago.  Reports that he took this medication because he liked how it was making him feel.  He denies ingestion with suicidal intent, though he does endorse a history of suicidal ideations and attempt in the past.  He was last followed by psychiatry 3 years ago.  Reports that he is an "addict" and abuses opiates.  He took 2 tramadol yesterday.  Also took 1 mg Klonopin this morning.  Denies any alcohol or other illicit drug use.    He is complaining of throbbing pain in his back and flank presently. Suggests this pain to be chronic to some extent from an old injury; however, states that he started to take the Clonidine after his pain worsened and he experienced paresthesias in his face and left side. Paresthesias have since resolved. Feels lightheaded since ingestion, but has not had any syncope. Denies fevers, chest pain, SOB, abdominal pain, incontinence, nausea and vomiting.  The history is provided by the patient. No language interpreter was used.  Drug Overdose      Past Medical History:  Diagnosis Date   Anxiety    Depression    GAD (generalized anxiety disorder)    Hypertension    Low back pain    Mixed hyperlipidemia    Panic attack     Patient Active Problem List   Diagnosis Date Noted   OD (overdose of drug), accidental or unintentional, initial encounter 11/27/2019    Opiate abuse, episodic (HCC) 11/27/2019   Bradycardia 11/27/2019    History reviewed. No pertinent surgical history.     Family History  Problem Relation Age of Onset   Kidney disease Mother    Cancer Mother    Cancer Father     Social History   Tobacco Use   Smoking status: Never Smoker  Substance Use Topics   Alcohol use: No    Alcohol/week: 0.0 standard drinks   Drug use: No    Types: Hydrocodone, Oxycodone    Home Medications Prior to Admission medications   Medication Sig Start Date End Date Taking? Authorizing Provider  clonazePAM (KLONOPIN) 1 MG tablet Take 1 mg by mouth 2 (two) times daily as needed for anxiety. 11/15/19  Yes [provider]  cloNIDine (CATAPRES) 0.1 MG tablet Take 0.1 mg by mouth daily as needed (BP is >160/90 every 8 hours).   Yes [provider]    Allergies    Buprenorphine hcl-naloxone hcl and Subutex [buprenorphine hcl]  Review of Systems   Review of Systems  Ten systems reviewed and are negative for acute change, except as noted in the HPI.    Physical Exam Updated Vital Signs BP (!) 195/108    Pulse (!) 40    Temp 97.7 F (36.5 C) (Oral)    Resp (!) 22    Ht 5\' 9"  (1.753 m)  Wt 91.2 kg    SpO2 98%    BMI 29.68 kg/m   Physical Exam Vitals and nursing note reviewed.  Constitutional:      General: He is not in acute distress.    Appearance: He is well-developed. He is not diaphoretic.     Comments: Patient slowed, slurred speech.  HENT:     Head: Normocephalic and atraumatic.  Eyes:     General: No scleral icterus.    Conjunctiva/sclera: Conjunctivae normal.  Cardiovascular:     Rate and Rhythm: Regular rhythm. Bradycardia present.     Pulses: Normal pulses.  Pulmonary:     Effort: Pulmonary effort is normal. No respiratory distress.     Breath sounds: No stridor. No wheezing.     Comments: Respirations even and unlabored. Lungs CTAB. Musculoskeletal:        General: Normal range of motion.       Cervical back: Normal range of motion.  Skin:    General: Skin is warm and dry.     Coloration: Skin is not pale.     Findings: No erythema or rash.  Neurological:     Mental Status: He is alert and oriented to person, place, and time.     Comments: GCS 15. Answers questions appropriately and follows commands. Moving all extremities spontaneously.  Psychiatric:        Speech: Speech is slurred.        Behavior: Behavior is slowed.     Comments: Denies SI or attempt     ED Results / Procedures / Treatments   Labs (all labs ordered are listed, but only abnormal results are displayed) Labs Reviewed  CBC WITH DIFFERENTIAL/PLATELET - Abnormal; Notable for the following components:      Result Value   WBC 17.0 (*)    Neutro Abs 14.3 (*)    Abs Immature Granulocytes 0.10 (*)    All other components within normal limits  COMPREHENSIVE METABOLIC PANEL - Abnormal; Notable for the following components:   Glucose, Bld 202 (*)    ALT 57 (*)    All other components within normal limits  ACETAMINOPHEN LEVEL - Abnormal; Notable for the following components:   Acetaminophen (Tylenol), Serum <10 (*)    All other components within normal limits  SALICYLATE LEVEL - Abnormal; Notable for the following components:   Salicylate Lvl <7.0 (*)    All other components within normal limits  SARS CORONAVIRUS 2 (TAT 6-24 HRS)  ETHANOL  MAGNESIUM  RAPID URINE DRUG SCREEN, HOSP PERFORMED  HIV ANTIBODY (ROUTINE TESTING W REFLEX)    EKG EKG Interpretation  Date/Time:  Sunday November 26 2019 22:25:59 EDT Ventricular Rate:  48 PR Interval:    QRS Duration: 103 QT Interval:  433 QTC Calculation: 387 R Axis:   -51 Text Interpretation: Sinus bradycardia LAD, consider left anterior fascicular block RSR' in V1 or V2, right VCD or RVH NO STEMI. No old tracing to compare Confirmed by Drema Pry [phone]) on 11/27/2019 2:57:50 AM   Radiology DG Chest Port 1 View  Result Date: 11/26/2019 CLINICAL  DATA:  Overdose EXAM: PORTABLE CHEST 1 VIEW COMPARISON:  06/27/2014 FINDINGS: Cardiomegaly. No confluent opacities, effusions or edema. No acute bony abnormality. IMPRESSION: Cardiomegaly.  No active disease. Electronically Signed   By: Charlett Nose M.D.   On: 11/26/2019 23:07    Procedures .Critical Care Performed by: Antony Madura, PA-C Authorized by: Antony Madura, PA-C   Critical care provider statement:    Critical care time (  minutes):  45   Critical care was necessary to treat or prevent imminent or life-threatening deterioration of the following conditions: clonidine OD.   Critical care was time spent personally by me on the following activities:  Discussions with consultants, evaluation of patient's response to treatment, examination of patient, ordering and performing treatments and interventions, ordering and review of laboratory studies, ordering and review of radiographic studies, pulse oximetry, re-evaluation of patient's condition, obtaining history from patient or surrogate and review of old charts   (including critical care time)  Medications Ordered in ED Medications  acetaminophen (TYLENOL) tablet 650 mg (has no administration in time range)    Or  acetaminophen (TYLENOL) suppository 650 mg (has no administration in time range)  ondansetron (ZOFRAN) tablet 4 mg (has no administration in time range)    Or  ondansetron (ZOFRAN) injection 4 mg (has no administration in time range)  enoxaparin (LOVENOX) injection 40 mg (has no administration in time range)  sodium chloride 0.9 % bolus 1,000 mL (0 mLs Intravenous Stopped 11/27/19 0356)  sodium chloride 0.9 % bolus 1,000 mL (1,000 mLs Intravenous New Bag/Given 11/27/19 0416)    ED Course  I have reviewed the triage vital signs and the nursing notes.  Pertinent labs & imaging results that were available during my care of the patient were reviewed by me and considered in my medical decision making (see chart for  details).  Clinical Course as of Nov 27 450  Wynelle Link Nov 26, 2019  2240 Spoke with poison control who recommend observation for at least 6 hours or until patient is back to baseline.  Recommend that naloxone be avoided.  Monitor patient for hypotension, sedation/CNS depression, and bradycardia.  If patient becomes significantly bradycardic, they recommend atropine as needed.   [KH]  2315 BP 171/108 on right arm and 180/109 on left arm.   [KH]  Mon Nov 27, 2019  4580 Remains hypertensive with BP 174 systolic. Mentation is the same. Patient denies any changes or new complaints. Was given PO water to drink by nursing which he is tolerating.   [KH]  0335 Patient with HR in the 30's on monitor. Went in and patient was sleeping. When up and stimulated, HR improved to 43-48 bpm. Able to tell me the hospital he is at, who he is, the year, and the current president.   [KH]  0347 MD Cardama at bedside to assess.   [KH]  [phone] Case discussed with Dr. Julian Reil who will assess in the ED for admission   [KH]    Clinical Course User Index [KH] Darylene Price   MDM Rules/Calculators/A&P                      49 year old male presenting to the emergency department after clonidine overdose.  Poison control notified who advised observation for at least 6 hours to ensure no developing hypotension, bradycardia, sedation/CNS depression.  They did advise atropine for bradycardia as needed.  While the patient has been bradycardic in the ED, he continues to [facility] well on multiple reassessments.  As he has been observed for 6 hours with no improvement in his bradycardia, will admit for inpatient observation.  Dr. Julian Reil to admit.   Final Clinical Impression(s) / ED Diagnoses Final diagnoses:  Clonidine overdose, undetermined intent, initial encounter  Bradycardia    Rx / DC Orders ED Discharge Orders    None       Antony Madura, PA-C 11/27/19 0452    Cardama,  Grayce Sessions, MD 11/27/19 [phone]

## 2019-11-27 ENCOUNTER — Encounter (HOSPITAL_COMMUNITY): Payer: Self-pay | Admitting: Internal Medicine

## 2019-11-27 DIAGNOSIS — I10 Essential (primary) hypertension: Secondary | ICD-10-CM

## 2019-11-27 DIAGNOSIS — T50901A Poisoning by unspecified drugs, medicaments and biological substances, accidental (unintentional), initial encounter: Secondary | ICD-10-CM

## 2019-11-27 DIAGNOSIS — R001 Bradycardia, unspecified: Secondary | ICD-10-CM | POA: Diagnosis present

## 2019-11-27 DIAGNOSIS — F1110 Opioid abuse, uncomplicated: Secondary | ICD-10-CM | POA: Diagnosis present

## 2019-11-27 DIAGNOSIS — T465X4A Poisoning by other antihypertensive drugs, undetermined, initial encounter: Secondary | ICD-10-CM

## 2019-11-27 LAB — MAGNESIUM: Magnesium: 2 mg/dL (ref 1.7–2.4)

## 2019-11-27 LAB — RAPID URINE DRUG SCREEN, HOSP PERFORMED
Amphetamines: NOT DETECTED
Barbiturates: NOT DETECTED
Benzodiazepines: POSITIVE — AB
Cocaine: NOT DETECTED
Opiates: NOT DETECTED
Tetrahydrocannabinol: NOT DETECTED

## 2019-11-27 LAB — C-REACTIVE PROTEIN: CRP: 0.7 mg/dL (ref <1.0)

## 2019-11-27 LAB — COMPREHENSIVE METABOLIC PANEL
ALT: 57 U/L — ABNORMAL HIGH (ref 0–44)
AST: 40 U/L (ref 15–41)
Albumin: 3.8 g/dL (ref 3.5–5.0)
Alkaline Phosphatase: 119 U/L (ref 38–126)
Anion gap: 8 (ref 5–15)
BUN: 10 mg/dL (ref 6–20)
CO2: 26 mmol/L (ref 22–32)
Calcium: 9.4 mg/dL (ref 8.9–10.3)
Chloride: 107 mmol/L (ref 98–111)
Creatinine, Ser: 1.08 mg/dL (ref 0.61–1.24)
GFR calc Af Amer: >60 mL/min (ref >60)
GFR calc non Af Amer: >60 mL/min (ref >60)
Glucose, Bld: 202 mg/dL — ABNORMAL HIGH (ref 70–99)
Potassium: 4.1 mmol/L (ref 3.5–5.1)
Sodium: 141 mmol/L (ref 135–145)
Total Bilirubin: 1.2 mg/dL (ref 0.3–1.2)
Total Protein: 6.9 g/dL (ref 6.5–8.1)

## 2019-11-27 LAB — HIV ANTIBODY (ROUTINE TESTING W REFLEX): HIV Screen 4th Generation wRfx: NONREACTIVE

## 2019-11-27 LAB — SARS CORONAVIRUS 2 (TAT 6-24 HRS): SARS Coronavirus 2: POSITIVE — AB

## 2019-11-27 MED ORDER — ONDANSETRON HCL 4 MG/2ML IJ SOLN: 4.0000 mg | Freq: Four times a day (QID) | INTRAMUSCULAR | Status: DC | PRN

## 2019-11-27 MED ORDER — ONDANSETRON HCL 4 MG PO TABS: 4.0000 mg | ORAL_TABLET | Freq: Four times a day (QID) | ORAL | Status: DC | PRN

## 2019-11-27 MED ORDER — ACETAMINOPHEN 325 MG PO TABS: 650.0000 mg | ORAL_TABLET | Freq: Four times a day (QID) | ORAL | Status: DC | PRN

## 2019-11-27 MED ORDER — SODIUM CHLORIDE 0.9 % IV BOLUS
1000.0000 mL | Freq: Once | INTRAVENOUS | Status: AC
  Administered 2019-11-27: 04:00:00 1000 mL via INTRAVENOUS

## 2019-11-27 MED ORDER — HYDRALAZINE HCL 20 MG/ML IJ SOLN: 10.0000 mg | INTRAMUSCULAR | Status: DC | PRN

## 2019-11-27 MED ORDER — AMLODIPINE BESYLATE 10 MG PO TABS
10.0000 mg | ORAL_TABLET | Freq: Every day | ORAL | Status: DC
  Administered 2019-11-27 – 2019-11-29 (×3): 10 mg via ORAL
  Filled 2019-11-27 (×3): qty 1

## 2019-11-27 MED ORDER — SODIUM CHLORIDE 0.9 % IV SOLN: INTRAVENOUS | Status: DC

## 2019-11-27 MED ORDER — ACETAMINOPHEN 650 MG RE SUPP: 650.0000 mg | Freq: Four times a day (QID) | RECTAL | Status: DC | PRN

## 2019-11-27 MED ORDER — ENOXAPARIN SODIUM 40 MG/0.4ML ~~LOC~~ SOLN
40.0000 mg | SUBCUTANEOUS | Status: DC
  Administered 2019-11-27 – 2019-11-29 (×3): 40 mg via SUBCUTANEOUS
  Filled 2019-11-27 (×3): qty 0.4

## 2019-11-27 NOTE — ED Provider Notes (Signed)
Attestation: Medical screening examination/treatment/procedure(s) were conducted as a shared visit with non-physician practitioner(s) and myself.  I personally evaluated the patient during the encounter.   Briefly, the patient is a 49 y.o. male with h/o HTN, here for OD on clonidine.   Vitals:   11/27/19 0245 11/27/19 0330  BP: (!) 164/93 (!) 195/108  Pulse: (!) 34 (!) 40  Resp: 19 (!) 22  Temp:    SpO2: 99% 98%    CONSTITUTIONAL:  well-appearing, NAD NEURO:  Alert and oriented x 3, no focal deficits EYES:  pupils equal and reactive ENT/NECK:  trachea midline, no JVD CARDIO:  brady rate, reg rhythm, well-perfused PULM:  None labored breathing GI/GU:  Abdomin non-distended MSK/SPINE:  No gross deformities, no edema SKIN:  no rash, atraumatic PSYCH:  Appropriate speech and behavior   EKG Interpretation  Date/Time:  Sunday November 26 2019 22:25:59 EDT Ventricular Rate:  48 PR Interval:    QRS Duration: 103 QT Interval:  433 QTC Calculation: 387 R Axis:   -51 Text Interpretation: Sinus bradycardia LAD, consider left anterior fascicular block RSR' in V1 or V2, right VCD or RVH NO STEMI. No old tracing to compare Confirmed by Drema Pry [phone]) on 11/27/2019 2:57:50 AM       coingestion labs reassuring. Patient bradycardic w/o block. Monitored for 6 hrs w/o improvement. Admitted for prolonged monitoring.      Nira Conn, MD 11/27/19 [phone]

## 2019-11-27 NOTE — Progress Notes (Signed)
Patient notified nurse that he is having trouble swallowing, throat feels tight. Denies SOB, sats 97% RA, RR 15-20, no stridor heard. Dr. Catha Gosselin notified. Order for swallow eval placed.

## 2019-11-27 NOTE — Progress Notes (Signed)
Pt arrived on unit. Alert and oriented X4. VSS stable

## 2019-11-27 NOTE — ED Notes (Signed)
SDU Breakfast Ordered 

## 2019-11-27 NOTE — Progress Notes (Signed)
This note also relates to the following rows which could not be included: Pulse Rate - Cannot attach notes to unvalidated device data ECG Heart Rate - Cannot attach notes to unvalidated device data Resp - Cannot attach notes to unvalidated device data SpO2 - Cannot attach notes to unvalidated device data   

## 2019-11-27 NOTE — Plan of Care (Signed)
  Problem: Education: Goal: Knowledge of General Education information will improve Description Including pain rating scale, medication(s)/side effects and non-pharmacologic comfort measures Outcome: Progressing   Problem: Health Behavior/Discharge Planning: Goal: Ability to manage health-related needs will improve Outcome: Progressing   

## 2019-11-27 NOTE — Progress Notes (Signed)
Order to transfer to 5W. Patient notified of transfer. Will transfer in bed.

## 2019-11-27 NOTE — H&P (Signed)
History and Physical    Noah Holland ZOX:[id number] DOB: [birth year] DOA: 11/26/2019  PCP: Garnetta Buddy, NP  Patient coming from: Home  I have personally briefly reviewed patient's old medical records in [place]  Chief Complaint: OD  HPI: Noah Holland is a 49 y.o. male with medical history significant of HTN, HLD, opiate abuse.  Pt took about 20 tabs of 0.1mg  clonidine over past 24h, wasn't suicide attempt, just liked the way it made him feel.  Last took 5 tabs 3-4h PTA.  Took 1mg  Klonopin yesterday AM  Took 2 tramadol day before.  No other EtOH or illicit drug use per patient.   ED Course: While in ED, has become bradycardic to the 30s.  Still remains hypertensive though and is asymptomatic.  Poison control says use atropine if he becomes symptomatic.   Review of Systems: As per HPI, otherwise all review of systems negative.  Past Medical History:  Diagnosis Date  . Anxiety   . Depression   . GAD (generalized anxiety disorder)   . Hypertension   . Low back pain   . Mixed hyperlipidemia   . Panic attack     History reviewed. No pertinent surgical history.   reports that he has never smoked. He does not have any smokeless tobacco history on file. He reports that he does not drink alcohol or use drugs.  Allergies  Allergen Reactions  . Buprenorphine Hcl-Naloxone Hcl     Aching, vomiting, diarrhea, chills  . Subutex [Buprenorphine Hcl]     Aching, chills, vomiting    Family History  Problem Relation Age of Onset  . Kidney disease Mother   . Cancer Mother   . Cancer Father      Prior to Admission medications   Medication Sig Start Date End Date Taking? Authorizing Provider  clonazePAM (KLONOPIN) 1 MG tablet Take 1 mg by mouth 2 (two) times daily as needed for anxiety. 11/15/19  Yes [provider]  cloNIDine (CATAPRES) 0.1 MG tablet Take 0.1 mg by mouth daily as needed (BP is >160/90 every 8 hours).   Yes [provider]     Physical Exam: Vitals:   11/27/19 0200 11/27/19 0230 11/27/19 0245 11/27/19 0330  BP: (!) 159/112 (!) 163/111 (!) 164/93 (!) 195/108  Pulse: (!) 43 (!) 40 (!) 34 (!) 40  Resp: 20 13 19  (!) 22  Temp:      TempSrc:      SpO2: 96% 98% 99% 98%  Weight:      Height:        Constitutional: NAD, calm, comfortable Eyes: PERRL, lids and conjunctivae normal ENMT: Mucous membranes are moist. Posterior pharynx clear of any exudate or lesions.Normal dentition.  Neck: normal, supple, no masses, no thyromegaly Respiratory: clear to auscultation bilaterally, no wheezing, no crackles. Normal respiratory effort. No accessory muscle use.  Cardiovascular: Regular rate and rhythm, no murmurs / rubs / gallops. No extremity edema. 2+ pedal pulses. No carotid bruits.  Abdomen: no tenderness, no masses palpated. No hepatosplenomegaly. Bowel sounds positive.  Musculoskeletal: no clubbing / cyanosis. No joint deformity upper and lower extremities. Good ROM, no contractures. Normal muscle tone.  Skin: no rashes, lesions, ulcers. No induration Neurologic: CN 2-12 grossly intact. Sensation intact, DTR normal. Strength 5/5 in all 4.  Psychiatric: Normal judgment and insight. Alert and oriented x 3. Normal mood.    Labs on Admission: I have personally reviewed following labs and imaging studies  CBC: Recent Labs  Lab  11/26/19 2231  WBC 17.0*  NEUTROABS 14.3*  HGB 14.7  HCT 43.2  MCV 86.2  PLT 353   Basic Metabolic Panel: Recent Labs  Lab 11/26/19 2231  NA 141  K 4.1  CL 107  CO2 26  GLUCOSE 202*  BUN 10  CREATININE 1.08  CALCIUM 9.4  MG 2.0   GFR: Estimated Creatinine Clearance: 90.3 mL/min (by C-G formula based on SCr of 1.08 mg/dL). Liver Function Tests: Recent Labs  Lab 11/26/19 2231  AST 40  ALT 57*  ALKPHOS 119  BILITOT 1.2  PROT 6.9  ALBUMIN 3.8   No results for input(s): LIPASE, AMYLASE in the last 168 hours. No results for input(s): AMMONIA in the last 168  hours. Coagulation Profile: No results for input(s): INR, PROTIME in the last 168 hours. Cardiac Enzymes: No results for input(s): CKTOTAL, CKMB, CKMBINDEX, TROPONINI in the last 168 hours. BNP (last 3 results) No results for input(s): PROBNP in the last 8760 hours. HbA1C: No results for input(s): HGBA1C in the last 72 hours. CBG: No results for input(s): GLUCAP in the last 168 hours. Lipid Profile: No results for input(s): CHOL, HDL, LDLCALC, TRIG, CHOLHDL, LDLDIRECT in the last 72 hours. Thyroid Function Tests: No results for input(s): TSH, T4TOTAL, FREET4, T3FREE, THYROIDAB in the last 72 hours. Anemia Panel: No results for input(s): VITAMINB12, FOLATE, FERRITIN, TIBC, IRON, RETICCTPCT in the last 72 hours. Urine analysis:    Component Value Date/Time   COLORURINE YELLOW 02/26/2014 1239   APPEARANCEUR CLEAR 02/26/2014 1239   LABSPEC 1.021 02/26/2014 1239   PHURINE 5.5 02/26/2014 1239   GLUCOSEU NEGATIVE 02/26/2014 1239   HGBUR LARGE (A) 02/26/2014 1239   BILIRUBINUR NEGATIVE 02/26/2014 1239   KETONESUR NEGATIVE 02/26/2014 1239   PROTEINUR 30 (A) 02/26/2014 1239   UROBILINOGEN 0.2 02/26/2014 1239   NITRITE NEGATIVE 02/26/2014 1239   LEUKOCYTESUR TRACE (A) 02/26/2014 1239    Radiological Exams on Admission: DG Chest Port 1 View  Result Date: 11/26/2019 CLINICAL DATA:  Overdose EXAM: PORTABLE CHEST 1 VIEW COMPARISON:  06/27/2014 FINDINGS: Cardiomegaly. No confluent opacities, effusions or edema. No acute bony abnormality. IMPRESSION: Cardiomegaly.  No active disease. Electronically Signed   By: Charlett Nose M.D.   On: 11/26/2019 23:07    EKG: Independently reviewed.  Assessment/Plan Principal Problem:   OD (overdose of drug), accidental or unintentional, initial encounter Active Problems:   Opiate abuse, episodic (HCC)   Bradycardia    1. OD on clonidine - with bradycardia 1. Tele monitor 2. Use atropine if he becomes symptomatic with bradycardia 3. Still  hypertensive despite bradycardia and OD  DVT prophylaxis: Lovenox Code Status: Full Family Communication: No family in room Disposition Plan: Home after admit Consults called: None Admission status: Place in 31    Noah Holland M. DO [facility]  How to contact the [facility] Attending or Consulting provider 7A - 7P or covering provider during after hours 7P -7A, for this patient?  1. Check the care team in [facility] and look for a) attending/consulting [facility] provider listed and b) the [facility] team listed 2. Log into [email or web address]  Amion Physician Scheduling and messaging for groups and whole hospitals  On call and physician scheduling software for group practices, residents, hospitalists and other medical providers for call, clinic, rotation and shift schedules. OnCall Enterprise is a hospital-wide system for scheduling doctors and paging doctors on call. EasyPlot is for scientific plotting and data analysis.  [email or web address]  and use [facility]'s universal password to access. If you  do not have the password, please contact the hospital operator.  3. Locate the [facility] provider you are looking for under [facility] and page to a number that you can be directly reached. 4. If you still have difficulty reaching the provider, please page the [facility] (Director on Call) for the Hospitalists listed on amion for assistance.  11/27/2019, 4:21 AM

## 2019-11-27 NOTE — Progress Notes (Signed)
Patient transferred from ED at 1155hrs. HR 38-40 BPM, SB; BP 190/95.  Dr. Catha Gosselin notified, will continue to monitor. Patient oriented to unit and plan of care for shift, verbalized understanding.

## 2019-11-27 NOTE — Progress Notes (Addendum)
PROGRESS NOTE    Noah Holland  CWC:[id number] DOB: 1971/05/13 DOA: 11/26/2019 PCP: Patrick Jupiter, NP   Brief Narrative:  49 year old male with history of hypertension, hyperlipidemia, opiate use presented with clonidine overdose, had taken 20 tablets of 0.1 mg in the past 24 hours.  Supposedly was not a suicide attempt, patient is likely it made him feel.  Admitted with bradycardia and hypertension.  Cardiology consulted.  Of note Covid test did come back positive. Assessment & Plan   Patient admitted earlier today by Dr. Lyda Perone.  See H&P for details.  Clonidine overdose with bradycardia and accelerated hypertension -Continue telemetry monitoring -Patient appears to be asymptomatic at this time -Cardiology consulted and appreciated -UDS ordered -poison control recommended observation and normal saline  Covid positive -Appears to be mainly asymptomatic -CRP ordered  Leukocytosis  -Suspect reactive -X-ray unremarkable for infection -Patient with no urinary symptoms -currently afebrile  DVT Prophylaxis Lovenox  Code Status: Full code  Family Communication: None at bedside  Disposition Plan: Admitted from home for clonidine overdose.  Currently hemodynamically unstable.  Pending cardiology consult.  Suspect home within the next 24 to 48 hours  Consultants Cardiology  Procedures  None  Antibiotics   Anti-infectives (From admission, onward)   None      Subjective:   Arvo Ealy seen and examined today.  Patient has no complaints this morning.  Denies any chest pain, shortness of breath, abdominal pain, nausea or vomiting, dizziness or headache.    Objective:   Vitals:   11/27/19 1103 11/27/19 1153 11/27/19 1222 11/27/19 1252  BP: (!) 193/93 (!) 176/101 (!) 158/83 (!) 198/102  Pulse: (!) 44 (!) 42 (!) 49 (!) 42  Resp: 17 17    Temp:  (!) 97.5 F (36.4 C)    TempSrc:  Oral    SpO2: 97% 98% 97% 97%  Weight:  92.9 kg    Height:  5\' 9"  (1.753 m)       Intake/Output Summary (Last 24 hours) at 11/27/2019 1424 Last data filed at 11/27/2019 0528 Gross per 24 hour  Intake 2198.8 ml  Output --  Net 2198.8 ml   Filed Weights   11/26/19 2229 11/27/19 1153  Weight: 91.2 kg 92.9 kg    Exam Alert and oriented x4.   Data Reviewed: I have personally reviewed following labs and imaging studies  CBC: Recent Labs  Lab 11/26/19 2231  WBC 17.0*  NEUTROABS 14.3*  HGB 14.7  HCT 43.2  MCV 86.2  PLT 353   Basic Metabolic Panel: Recent Labs  Lab 11/26/19 2231  NA 141  K 4.1  CL 107  CO2 26  GLUCOSE 202*  BUN 10  CREATININE 1.08  CALCIUM 9.4  MG 2.0   GFR: Estimated Creatinine Clearance: 91.1 mL/min (by C-G formula based on SCr of 1.08 mg/dL). Liver Function Tests: Recent Labs  Lab 11/26/19 2231  AST 40  ALT 57*  ALKPHOS 119  BILITOT 1.2  PROT 6.9  ALBUMIN 3.8   No results for input(s): LIPASE, AMYLASE in the last 168 hours. No results for input(s): AMMONIA in the last 168 hours. Coagulation Profile: No results for input(s): INR, PROTIME in the last 168 hours. Cardiac Enzymes: No results for input(s): CKTOTAL, CKMB, CKMBINDEX, TROPONINI in the last 168 hours. BNP (last 3 results) No results for input(s): PROBNP in the last 8760 hours. HbA1C: No results for input(s): HGBA1C in the last 72 hours. CBG: No results for input(s): GLUCAP in the last 168 hours.  Lipid Profile: No results for input(s): CHOL, HDL, LDLCALC, TRIG, CHOLHDL, LDLDIRECT in the last 72 hours. Thyroid Function Tests: No results for input(s): TSH, T4TOTAL, FREET4, T3FREE, THYROIDAB in the last 72 hours. Anemia Panel: No results for input(s): VITAMINB12, FOLATE, FERRITIN, TIBC, IRON, RETICCTPCT in the last 72 hours. Urine analysis:    Component Value Date/Time   COLORURINE YELLOW 02/26/2014 1239   APPEARANCEUR CLEAR 02/26/2014 1239   LABSPEC 1.021 02/26/2014 1239   PHURINE 5.5 02/26/2014 1239   GLUCOSEU NEGATIVE 02/26/2014 1239   HGBUR  LARGE (A) 02/26/2014 1239   BILIRUBINUR NEGATIVE 02/26/2014 1239   KETONESUR NEGATIVE 02/26/2014 1239   PROTEINUR 30 (A) 02/26/2014 1239   UROBILINOGEN 0.2 02/26/2014 1239   NITRITE NEGATIVE 02/26/2014 1239   LEUKOCYTESUR TRACE (A) 02/26/2014 1239   Sepsis Labs: @LABRCNTIP (procalcitonin:4,lacticidven:4)  ) Recent Results (from the past 240 hour(s))  SARS CORONAVIRUS 2 (TAT 6-24 HRS) Nasopharyngeal Nasopharyngeal Swab     Status: Abnormal   Collection Time: 11/27/19  4:15 AM   Specimen: Nasopharyngeal Swab  Result Value Ref Range Status   SARS Coronavirus 2 POSITIVE (A) NEGATIVE Final    Comment: RESULT CALLED TO, READ BACK BY AND VERIFIED WITH: Bronwen Betters RN 12:00 11/27/19 ([email or web address]) (NOTE) SARS-CoV-2 target nucleic acids are DETECTED. The SARS-CoV-2 RNA is generally detectable in upper and lower respiratory specimens during the acute phase of infection. Positive results are indicative of the presence of SARS-CoV-2 RNA. Clinical correlation with patient history and other diagnostic information is  necessary to determine patient infection status. Positive results do not rule out bacterial infection or co-infection with other viruses.  The expected result is Negative. Fact Sheet for Patients: SugarRoll.be Fact Sheet for Healthcare Providers: https://www.woods-mathews.com/ This test is not yet approved or cleared by the [place] FDA and  has been authorized for detection and/or diagnosis of SARS-CoV-2 by FDA under an Emergency Use Authorization (EUA). This EUA will remain  in effect (meaning this test can be used) for the  duration of the COVID-19 declaration under Section 564(b)(1) of the Act, 21 U.S.C. section 360bbb-3(b)(1), unless the authorization is terminated or revoked sooner. Performed at [facility], [place] [address], [place], [place] [address]       Radiology Studies: DG Chest Port 1 View  Result Date:  11/26/2019 CLINICAL DATA:  Overdose EXAM: PORTABLE CHEST 1 VIEW COMPARISON:  06/27/2014 FINDINGS: Cardiomegaly. No confluent opacities, effusions or edema. No acute bony abnormality. IMPRESSION: Cardiomegaly.  No active disease. Electronically Signed   By: Rolm Baptise M.D.   On: 11/26/2019 23:07     Scheduled Meds: . enoxaparin (LOVENOX) injection  40 mg Subcutaneous Q24H   Continuous Infusions:   LOS: 0 days   Time Spent in minutes   30 minutes  Jerald Hennington D.O. on 11/27/2019 at 2:24 PM  Between 7am to 7pm - Please see pager noted on [email or web address]  After 7pm go to [email or web address]  And look for the night coverage person covering for me after hours  [facility] Office  [phone]

## 2019-11-27 NOTE — Progress Notes (Signed)
   11/27/19 1830  What Happened  Was fall witnessed? No  Was patient injured? No  Patient found other (Comment) (on bede)  Found by Staff-comment Fleet Contras RN)  Stated prior activity bathroom-unassisted  Follow Up  MD notified Mikhail  Time MD notified [phone]  Family notified No - patient refusal  Additional tests No  Simple treatment Ice (PRN)  Adult Fall Risk Assessment  Risk Factor Category (scoring not indicated) Fall has occurred during this admission (document High fall risk)  Patient Fall Risk Level High fall risk  Adult Fall Risk Interventions  Required Bundle Interventions *See Row Information* High fall risk - low, moderate, and high requirements implemented  Additional Interventions Use of appropriate toileting equipment (bedpan, BSC, etc.);PT/OT need assessed if change in mobility from baseline  Screening for Fall Injury Risk (To be completed on HIGH fall risk patients) - Assessing Need for Low Bed  Risk For Fall Injury- Low Bed Criteria Previous fall this admission  Will Implement Low Bed and Floor Mats Yes  Vitals  Temp (!) 97.4 F (36.3 C)  Temp Source Oral  BP (!) 179/92  MAP (mmHg) 117  Oxygen Therapy  O2 Device Room Air  Pain Assessment  Pain Scale 0-10  Pain Score 0  Neurological  Neuro (WDL) X  Level of Consciousness Responds to Voice  Orientation Level Oriented X4  Cognition Appropriate at baseline  Speech Clear  Glasgow Coma Scale  Eye Opening 3  Best Verbal Response (NON-intubated) 5  Best Motor Response 6  Glasgow Coma Scale Score 14  Musculoskeletal  Musculoskeletal (WDL) WDL  Integumentary  Integumentary (WDL) WDL

## 2019-11-27 NOTE — Consult Note (Signed)
Cardiology Consultation:   Patient ID: Noah Holland MRN: [id number]; DOB: [birth year]  Admit date: 11/26/2019 Date of Consult: 11/27/2019  Primary Care Provider: Patrick Jupiter, NP Primary Cardiologist: No primary care provider on file.  Primary Electrophysiologist:  None    Patient Profile:   Noah Holland is a 49 y.o. male with a hx of chronic opiate use who is being seen today for the evaluation of bradycardia in the setting of clonidine overdose at the request of Dr Catha Gosselin.  History of Present Illness:   Noah Holland has a history of hypertension and chronic opiate use.  He took approximately 20 tabs of 0.1 mg clonidine over the past 24 hours.   He was reported to have bradycardia on telemetry and cardiology consultation is requested.  The patient has not had consistent antihypertensive medication over time.  He has been taking clonidine on an as-needed basis.  He states he takes it most days.  He has been on some other maintenance medications in the remote past but has not been on any daily schedule antihypertensives recently.  The patient states that he took about 20 clonidine yesterday because his blood pressure was very high and he just kept taking them to try to bring it down.  He had some associated chest discomfort.  He did not have shortness of breath, edema, lightheadedness, or heart palpitations.  He has subsequently tested positive for COVID-19.  From a symptomatic perspective, he only complains of sore throat and pain with swallowing.  He denies cough, shortness of breath, or myalgias.   Past Medical History:  Diagnosis Date  . Anxiety   . Depression   . GAD (generalized anxiety disorder)   . Hypertension   . Low back pain   . Mixed hyperlipidemia   . Panic attack     History reviewed. No pertinent surgical history.   Home Medications:  Prior to Admission medications   Medication Sig Start Date End Date Taking? Authorizing Provider  clonazePAM (KLONOPIN) 1  MG tablet Take 1 mg by mouth 2 (two) times daily as needed for anxiety. 11/15/19  Yes [provider]  cloNIDine (CATAPRES) 0.1 MG tablet Take 0.1 mg by mouth daily as needed (BP is >160/90 every 8 hours).   Yes [provider]    Inpatient Medications: Scheduled Meds: . enoxaparin (LOVENOX) injection  40 mg Subcutaneous Q24H   Continuous Infusions: . sodium chloride     PRN Meds: acetaminophen **OR** acetaminophen, ondansetron **OR** ondansetron (ZOFRAN) IV  Allergies:    Allergies  Allergen Reactions  . Buprenorphine Hcl-Naloxone Hcl     Aching, vomiting, diarrhea, chills  . Subutex [Buprenorphine Hcl]     Aching, chills, vomiting    Social History:   Social History   Socioeconomic History  . Marital status: Married    Spouse name: Not on file  . Number of children: Not on file  . Years of education: Not on file  . Highest education level: Not on file  Occupational History  . Not on file  Tobacco Use  . Smoking status: Never Smoker  Substance and Sexual Activity  . Alcohol use: No    Alcohol/week: 0.0 standard drinks  . Drug use: No    Types: Hydrocodone, Oxycodone  . Sexual activity: Not on file  Other Topics Concern  . Not on file  Social History Narrative   Drinks lots of caffeine.   Social Determinants of Health   Financial Resource Strain:   . Difficulty of  Paying Living Expenses:   Food Insecurity:   . Worried About Charity fundraiser in the Last Year:   . Arboriculturist in the Last Year:   Transportation Needs:   . Film/video editor (Medical):   Marland Kitchen Lack of Transportation (Non-Medical):   Physical Activity:   . Days of Exercise per Week:   . Minutes of Exercise per Session:   Stress:   . Feeling of Stress :   Social Connections:   . Frequency of Communication with Friends and Family:   . Frequency of Social Gatherings with Friends and Family:   . Attends Religious Services:   . Active Member of Clubs or Organizations:     . Attends Archivist Meetings:   Marland Kitchen Marital Status:   Intimate Partner Violence:   . Fear of Current or Ex-Partner:   . Emotionally Abused:   Marland Kitchen Physically Abused:   . Sexually Abused:     Family History:    Family History  Problem Relation Age of Onset  . Kidney disease Mother   . Cancer Mother   . Cancer Father      ROS:  Please see the history of present illness.  All other ROS reviewed and negative.     Physical Exam/Data:   Vitals:   11/27/19 1153 11/27/19 1222 11/27/19 1252 11/27/19 1511  BP: (!) 176/101 (!) 158/83 (!) 198/102 (!) 150/95  Pulse: (!) 42 (!) 49 (!) 42   Resp: 17     Temp: (!) 97.5 F (36.4 C)   (!) 97.4 F (36.3 C)  TempSrc: Oral   Oral  SpO2: 98% 97% 97%   Weight: 92.9 kg     Height: 5\' 9"  (1.753 m)       Intake/Output Summary (Last 24 hours) at 11/27/2019 1516 Last data filed at 11/27/2019 0528 Gross per 24 hour  Intake 2198.8 ml  Output --  Net 2198.8 ml   Last 3 Weights 11/27/2019 11/26/2019 12/12/2015  Weight (lbs) 204 lb 14.4 oz 201 lb 209 lb  Weight (kg) 92.942 kg 91.173 kg 94.802 kg     Body mass index is 30.26 kg/m.  General:  Well nourished, well developed, in no acute distress HEENT: normal Lymph: no adenopathy Neck: no JVD Endocrine:  No thryomegaly Vascular: No carotid bruits; FA pulses 2+ bilaterally  Cardiac:  normal S1, S2; RRR; no murmur  Lungs:  clear to auscultation bilaterally, no wheezing, rhonchi or rales  Abd: soft, nontender, no hepatomegaly, obese Ext: no edema Musculoskeletal:  No deformities, BUE and BLE strength normal and equal Skin: warm and dry  Neuro:  CNs 2-12 intact, no focal abnormalities noted Psych:  Normal affect   EKG:  The EKG was personally reviewed and demonstrates: Sinus bradycardia 48 bpm, leftward axis  Telemetry:  Telemetry was personally reviewed and demonstrates: Sinus bradycardia heart rate 48 to 50 bpm  Relevant CV Studies: None  Laboratory Data:  High Sensitivity  Troponin:  No results for input(s): TROPONINIHS in the last 720 hours.   Chemistry Recent Labs  Lab 11/26/19 2231  NA 141  K 4.1  CL 107  CO2 26  GLUCOSE 202*  BUN 10  CREATININE 1.08  CALCIUM 9.4  GFRNONAA >60  GFRAA >60  ANIONGAP 8    Recent Labs  Lab 11/26/19 2231  PROT 6.9  ALBUMIN 3.8  AST 40  ALT 57*  ALKPHOS 119  BILITOT 1.2   Hematology Recent Labs  Lab 11/26/19 2231  WBC  17.0*  RBC 5.01  HGB 14.7  HCT 43.2  MCV 86.2  MCH 29.3  MCHC 34.0  RDW 13.4  PLT 353   BNPNo results for input(s): BNP, PROBNP in the last 168 hours.  DDimer No results for input(s): DDIMER in the last 168 hours.   Radiology/Studies:  DG Chest Port 1 View  Result Date: 11/26/2019 CLINICAL DATA:  Overdose EXAM: PORTABLE CHEST 1 VIEW COMPARISON:  06/27/2014 FINDINGS: Cardiomegaly. No confluent opacities, effusions or edema. No acute bony abnormality. IMPRESSION: Cardiomegaly.  No active disease. Electronically Signed   By: Charlett Nose M.D.   On: 11/26/2019 23:07    Assessment and Plan:   1. Bradycardia, drug-induced 2. Hypertension, uncontrolled  There is no evidence of high-grade AV block.  The patient appears to be asymptomatic.  As long as his rhythm remains stable I think he can be discharged safely tomorrow.  I would discontinue clonidine and treat his hypertension with amlodipine 10 mg daily.  He will likely need multidrug antihypertensive therapy.  Would add ACE/ARB as next drug if needed with further medicine titration as an outpatient.  No cardiology follow-up indicated.  Please call if any questions.  [facility] will sign off.   Medication Recommendations:  Stop clonidine as a maintenance medication. Start amlodipine 10 mg daily. Other recommendations (labs, testing, etc):  none Follow up as an outpatient:  PCP  For questions or updates, please contact [facility] Please consult [email or web address] for contact info under     Signed, Tonny Bollman, MD  11/27/2019  3:16 PM

## 2019-11-28 LAB — BASIC METABOLIC PANEL
Anion gap: 12 (ref 5–15)
BUN: 8 mg/dL (ref 6–20)
CO2: 22 mmol/L (ref 22–32)
Calcium: 9.1 mg/dL (ref 8.9–10.3)
Chloride: 105 mmol/L (ref 98–111)
Creatinine, Ser: 0.99 mg/dL (ref 0.61–1.24)
GFR calc Af Amer: >60 mL/min (ref >60)
GFR calc non Af Amer: >60 mL/min (ref >60)
Glucose, Bld: 172 mg/dL — ABNORMAL HIGH (ref 70–99)
Potassium: 3.5 mmol/L (ref 3.5–5.1)
Sodium: 139 mmol/L (ref 135–145)

## 2019-11-28 LAB — C-REACTIVE PROTEIN: CRP: 10.2 mg/dL — ABNORMAL HIGH (ref <1.0)

## 2019-11-28 LAB — CBC
HCT: 40.9 % (ref 39.0–52.0)
Hemoglobin: 14.2 g/dL (ref 13.0–17.0)
MCH: 29.8 pg (ref 26.0–34.0)
MCHC: 34.7 g/dL (ref 30.0–36.0)
MCV: 85.7 fL (ref 80.0–100.0)
Platelets: 282 10*3/uL (ref 150–400)
RBC: 4.77 MIL/uL (ref 4.22–5.81)
RDW: 13.5 % (ref 11.5–15.5)
WBC: 19.7 10*3/uL — ABNORMAL HIGH (ref 4.0–10.5)
nRBC: 0 % (ref 0.0–0.2)

## 2019-11-28 LAB — MAGNESIUM: Magnesium: 1.7 mg/dL (ref 1.7–2.4)

## 2019-11-28 LAB — D-DIMER, QUANTITATIVE: D-Dimer, Quant: 0.52 ug/mL-FEU — ABNORMAL HIGH (ref 0.00–0.50)

## 2019-11-28 LAB — PROCALCITONIN: Procalcitonin: 0.59 ng/mL

## 2019-11-28 NOTE — Progress Notes (Signed)
  Speech Language Pathology  Patient Details Name: Noah Holland MRN: [id number] DOB: 08/10/1971 Today's Date: 11/28/2019 Time:  -    Pt alert, RN present. Screen via pt report and recent illness. When he first was admitted he had globus pharyngeal sensation which is resolved. Stated he has history of GERD. Pt states he has not had any difficulty yesterday or today and RN affirmed. Pt does not appear to need full swallow assessment.               Royce Macadamia 11/28/2019, 1:54 PM    Breck Coons Lonell Face.Ed Nurse, children's [phone] Office [phone]  '

## 2019-11-28 NOTE — Progress Notes (Signed)
PROGRESS NOTE                                                                                                                                                                                                             Patient Demographics:    Noah Holland, is a 49 y.o. male, DOB - 03-Jul-1971, XIH:[id number]  Outpatient Primary MD for the patient is Cox, Fredric Dine, NP    LOS - 1  Admit date - 11/26/2019    Chief Complaint  Patient presents with  . Drug Overdose       Brief Narrative - Noah Holland is a 49 y.o. male with medical history significant of HTN, HLD, opiate abuse. Pt took about 20 tabs of 0.35m clonidine over past 24h, per patient as BP was running high, wife says she is not 100% sure that he was not suicidal as they were having domestic problems.     Subjective:    Noah Holland has, No headache, No chest pain, No abdominal pain - No Nausea, No new weakness tingling or numbness, no Cough - SOB.     Assessment  & Plan :    Principal Problem:   OD (overdose of drug), accidental or unintentional, initial encounter Active Problems:   Opiate abuse, episodic (HCC)   Bradycardia   1. Covid 19 infection incidental.  No hypoxia, chest x-ray clear.  For now monitor.  CRP is elevated, if he develops any shortness of breath or CRP continues to go higher we may try him on low-dose steroids and remdesivir.  For now will monitor.   Encouraged the patient to sit up in chair in the daytime use I-S and flutter valve for pulmonary toiletry and then prone in bed when at night.  Will advance activity and titrate down oxygen as possible.    SpO2: 94 %  Recent Labs  Lab 11/27/19 0415 11/27/19 0625 11/28/19 0908  CRP  --  0.7 10.2*  DDIMER  --   --  0.52*  SARSCOV2NAA POSITIVE*  --   --     Hepatic Function Latest Ref Rng & Units 11/26/2019 09/07/2011  Total Protein 6.5 - 8.1 g/dL 6.9 7.7  Albumin 3.5  - 5.0 g/dL 3.8 4.2  AST 15 - 41 U/L 40 30  ALT 0 - 44 U/L 57(H) 36  Alk  Phosphatase 38 - 126 U/L 119 101  Total Bilirubin 0.3 - 1.2 mg/dL 1.2 0.3     2.  Clonidine overdose.  Suicide attempt not ruled out, discussed with wife they were having domestic issues and she is not certain that this was entirely a benign process.  Further clonidine held, heart rate gradually improving, will check chronotropic response with ambulation, will also have psychiatry evaluate the patient.  Although he denies any suicidal ideation.  3.  History of narcotic abuse.  Counseled to quit.  4.  Leukocytosis.  Most likely reactionary however with history of narcotic abuse I will check procalcitonin and baseline blood cultures as well.  Monitor.  Although he denies any IV drug use.     Condition - Fair  Family Communication  :  Wife Terri on 11/28/19 [phone]  Code Status :  Full  Diet :   Diet Order            Diet regular Room service appropriate? Yes; Fluid consistency: Thin  Diet effective now               Disposition Plan  : In the hospital for clonidine overdose with intense bradycardia.  Also mild COVID-19 infection which is being monitored.  Consults  :  Cards, Psych  Procedures  :    PUD Prophylaxis :   DVT Prophylaxis  :  Lovenox    Lab Results  Component Value Date   PLT 282 11/28/2019    Inpatient Medications  Scheduled Meds: . amLODipine  10 mg Oral Daily  . enoxaparin (LOVENOX) injection  40 mg Subcutaneous Q24H   Continuous Infusions: PRN Meds:.acetaminophen **OR** acetaminophen, ondansetron **OR** ondansetron (ZOFRAN) IV  Antibiotics  :    Anti-infectives (From admission, onward)   None       Time Spent in minutes  30   Lala Lund M.D on 11/28/2019 at 11:48 AM  To page go to [email or web address] - password [place]  [facility] -  Office  [phone]   See all Orders from today for further details    Objective:   Vitals:   11/27/19 2000  11/27/19 2022 11/28/19 1000 11/28/19 1003  BP: (!) 182/99 (!) 157/97  (!) 141/92  Pulse: (!) 48 (!) 51 60   Resp: 20 15 (!) 23   Temp:  97.8 F (36.6 C)    TempSrc:  Oral    SpO2: 95% 95% 94%   Weight:      Height:        Wt Readings from Last 3 Encounters:  11/27/19 92.9 kg  12/12/15 94.8 kg  09/07/11 88.5 kg     Intake/Output Summary (Last 24 hours) at 11/28/2019 1148 Last data filed at 11/28/2019 0730 Gross per 24 hour  Intake 1312.23 ml  Output 2175 ml  Net -862.77 ml     Physical Exam  Awake Alert, No new F.N deficits, Normal affect [place].AT,PERRAL Supple Neck,No JVD, No cervical lymphadenopathy appriciated.  Symmetrical Chest wall movement, Good air movement bilaterally, CTAB RRR,No Gallops,Rubs or new Murmurs, No Parasternal Heave +ve B.Sounds, Abd Soft, No tenderness, No organomegaly appriciated, No rebound - guarding or rigidity. No Cyanosis, Clubbing or edema, No new Rash or bruise     Data Review:    CBC Recent Labs  Lab 11/26/19 2231 11/28/19 0353  WBC 17.0* 19.7*  HGB 14.7 14.2  HCT 43.2 40.9  PLT 353 282  MCV 86.2 85.7  MCH 29.3 29.8  MCHC 34.0 34.7  RDW 13.4  13.5  LYMPHSABS 1.6  --   MONOABS 0.8  --   EOSABS 0.1  --   BASOSABS 0.1  --     Chemistries  Recent Labs  Lab 11/26/19 2231 11/28/19 0353 11/28/19 0908  NA 141 139  --   K 4.1 3.5  --   CL 107 105  --   CO2 26 22  --   GLUCOSE 202* 172*  --   BUN 10 8  --   CREATININE 1.08 0.99  --   CALCIUM 9.4 9.1  --   AST 40  --   --   ALT 57*  --   --   ALKPHOS 119  --   --   BILITOT 1.2  --   --   MG 2.0  --  1.7     ------------------------------------------------------------------------------------------------------------------ No results for input(s): CHOL, HDL, LDLCALC, TRIG, CHOLHDL, LDLDIRECT in the last 72 hours.  No results found for: HGBA1C ------------------------------------------------------------------------------------------------------------------ No results for  input(s): TSH, T4TOTAL, T3FREE, THYROIDAB in the last 72 hours.  Invalid input(s): FREET3  Cardiac Enzymes No results for input(s): CKMB, TROPONINI, MYOGLOBIN in the last 168 hours.  Invalid input(s): CK ------------------------------------------------------------------------------------------------------------------ No results found for: BNP  Micro Results Recent Results (from the past 240 hour(s))  SARS CORONAVIRUS 2 (TAT 6-24 HRS) Nasopharyngeal Nasopharyngeal Swab     Status: Abnormal   Collection Time: 11/27/19  4:15 AM   Specimen: Nasopharyngeal Swab  Result Value Ref Range Status   SARS Coronavirus 2 POSITIVE (A) NEGATIVE Final    Comment: RESULT CALLED TO, READ BACK BY AND VERIFIED WITH: Bronwen Betters RN 12:00 11/27/19 ([email or web address]) (NOTE) SARS-CoV-2 target nucleic acids are DETECTED. The SARS-CoV-2 RNA is generally detectable in upper and lower respiratory specimens during the acute phase of infection. Positive results are indicative of the presence of SARS-CoV-2 RNA. Clinical correlation with patient history and other diagnostic information is  necessary to determine patient infection status. Positive results do not rule out bacterial infection or co-infection with other viruses.  The expected result is Negative. Fact Sheet for Patients: SugarRoll.be Fact Sheet for Healthcare Providers: https://www.woods-mathews.com/ This test is not yet approved or cleared by the [place] FDA and  has been authorized for detection and/or diagnosis of SARS-CoV-2 by FDA under an Emergency Use Authorization (EUA). This EUA will remain  in effect (meaning this test can be used) for the  duration of the COVID-19 declaration under Section 564(b)(1) of the Act, 21 U.S.C. section 360bbb-3(b)(1), unless the authorization is terminated or revoked sooner. Performed at [facility], [address], [place], [place] [address]     Radiology  Reports DG Chest [place] 1 View  Result Date: 11/26/2019 CLINICAL DATA:  Overdose EXAM: PORTABLE CHEST 1 VIEW COMPARISON:  06/27/2014 FINDINGS: Cardiomegaly. No confluent opacities, effusions or edema. No acute bony abnormality. IMPRESSION: Cardiomegaly.  No active disease. Electronically Signed   By: Rolm Baptise M.D.   On: 11/26/2019 23:07

## 2019-11-28 NOTE — Consult Note (Signed)
Attempted three times to make contact with patient via behavioral health ipad. Will attempt to reassess tomorrow.

## 2019-11-28 NOTE — Plan of Care (Signed)
  Problem: Education: Goal: Knowledge of General Education information will improve Description Including pain rating scale, medication(s)/side effects and non-pharmacologic comfort measures Outcome: Progressing   Problem: Health Behavior/Discharge Planning: Goal: Ability to manage health-related needs will improve Outcome: Progressing   

## 2019-11-28 NOTE — Progress Notes (Signed)
RT instructed patient on the use of a flutter valve and incentive spirometer.  Patient able to demonstrate back good technique on both.  Patient able to reach using the incentive spirometer.

## 2019-11-28 NOTE — Progress Notes (Signed)
Delivered patient belongings from drop off at the [facility]. Cell phone charger (cord and block) and deodorant

## 2019-11-29 LAB — CBC WITH DIFFERENTIAL/PLATELET
Abs Immature Granulocytes: 0.02 10*3/uL (ref 0.00–0.07)
Basophils Absolute: 0 10*3/uL (ref 0.0–0.1)
Basophils Relative: 0 %
Eosinophils Absolute: 0.3 10*3/uL (ref 0.0–0.5)
Eosinophils Relative: 4 %
HCT: 38.9 % — ABNORMAL LOW (ref 39.0–52.0)
Hemoglobin: 13.1 g/dL (ref 13.0–17.0)
Immature Granulocytes: 0 %
Lymphocytes Relative: 15 %
Lymphs Abs: 1.3 10*3/uL (ref 0.7–4.0)
MCH: 29.7 pg (ref 26.0–34.0)
MCHC: 33.7 g/dL (ref 30.0–36.0)
MCV: 88.2 fL (ref 80.0–100.0)
Monocytes Absolute: 0.8 10*3/uL (ref 0.1–1.0)
Monocytes Relative: 10 %
Neutro Abs: 6 10*3/uL (ref 1.7–7.7)
Neutrophils Relative %: 71 %
Platelets: 237 10*3/uL (ref 150–400)
RBC: 4.41 MIL/uL (ref 4.22–5.81)
RDW: 13.8 % (ref 11.5–15.5)
WBC: 8.5 10*3/uL (ref 4.0–10.5)
nRBC: 0 % (ref 0.0–0.2)

## 2019-11-29 LAB — COMPREHENSIVE METABOLIC PANEL
ALT: 45 U/L — ABNORMAL HIGH (ref 0–44)
AST: 31 U/L (ref 15–41)
Albumin: 3.3 g/dL — ABNORMAL LOW (ref 3.5–5.0)
Alkaline Phosphatase: 115 U/L (ref 38–126)
Anion gap: 10 (ref 5–15)
BUN: 6 mg/dL (ref 6–20)
CO2: 26 mmol/L (ref 22–32)
Calcium: 8.8 mg/dL — ABNORMAL LOW (ref 8.9–10.3)
Chloride: 103 mmol/L (ref 98–111)
Creatinine, Ser: 0.96 mg/dL (ref 0.61–1.24)
GFR calc Af Amer: >60 mL/min (ref >60)
GFR calc non Af Amer: >60 mL/min (ref >60)
Glucose, Bld: 119 mg/dL — ABNORMAL HIGH (ref 70–99)
Potassium: 3.5 mmol/L (ref 3.5–5.1)
Sodium: 139 mmol/L (ref 135–145)
Total Bilirubin: 1.3 mg/dL — ABNORMAL HIGH (ref 0.3–1.2)
Total Protein: 6.1 g/dL — ABNORMAL LOW (ref 6.5–8.1)

## 2019-11-29 LAB — PROCALCITONIN: Procalcitonin: 0.36 ng/mL

## 2019-11-29 LAB — D-DIMER, QUANTITATIVE: D-Dimer, Quant: 0.4 ug/mL-FEU (ref 0.00–0.50)

## 2019-11-29 LAB — MAGNESIUM: Magnesium: 1.7 mg/dL (ref 1.7–2.4)

## 2019-11-29 LAB — C-REACTIVE PROTEIN: CRP: 10.5 mg/dL — ABNORMAL HIGH (ref <1.0)

## 2019-11-29 MED ORDER — PAROXETINE HCL 20 MG PO TABS: 20.0000 mg | ORAL_TABLET | Freq: Every day | ORAL | 0 refills | Status: AC

## 2019-11-29 MED ORDER — AMLODIPINE BESYLATE 10 MG PO TABS: 10.0000 mg | ORAL_TABLET | Freq: Every day | ORAL | 1 refills | Status: DC

## 2019-11-29 MED ORDER — AMLODIPINE BESYLATE 10 MG PO TABS: 10.0000 mg | ORAL_TABLET | Freq: Every day | ORAL | 0 refills | Status: AC

## 2019-11-29 MED ORDER — PAROXETINE HCL 20 MG PO TABS: 20.0000 mg | ORAL_TABLET | Freq: Every day | ORAL | 0 refills | Status: DC

## 2019-11-29 MED FILL — PARoxetine HCL 20 MG TABS: 20 | 30 days supply | Qty: 30 | Fill #0

## 2019-11-29 MED FILL — AMLODIPINE BESYLATE 10 MG T: 10 | 30 days supply | Qty: 30 | Fill #0

## 2019-11-29 NOTE — Consult Note (Signed)
Telepsych Consultation   Reason for Consult:  "Drug overdose question attempted suicide" Referring Physician:  Dr. Randol Kern Location of Patient: [facility] [phone] Location of Provider: [facility]  Patient Identification: Noah Holland MRN:  [id number] Principal Diagnosis: OD (overdose of drug), accidental or unintentional, initial encounter Diagnosis:  Principal Problem:   OD (overdose of drug), accidental or unintentional, initial encounter Active Problems:   Opiate abuse, episodic (HCC)   Bradycardia   Total Time spent with patient: 30 minutes  Subjective:   Noah Holland is a 49 y.o. male patient.  Patient assessed by nurse practitioner.  Patient alert and oriented, answers appropriately.  Patient reports "I am an addict and I am addicted to pain pills, when I take something it makes me feel good I can get out of control, that is what happened with the clonidine."  Patient reports recent relapse on opioids times approximately 4 days. Patient denies suicidal and homicidal ideations.  Patient denies behaviors of self-harm.  Patient reports 1 prior suicide attempt approximately 5 years ago.  Patient denies auditory and visual hallucinations.  Patient denies alcohol use.  Patient endorses plan to abstain from opioids moving forward.  Patient reports history of talk therapy to address substance use issues.  Patient reports history of use of Paxil, patient would like to continue Paxil at this time. Patient reports he lives at home with his wife.  Patient denies access to weapons.  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  Patient reports "good" sleep and "good" appetite.  Patient gives verbal consent to speak with his wife, Camelia Holland phone number [phone].  Patient's wife denies concerns for patient safety.  Patient's wife denies patient has access to weapons.   HPI: Patient admitted after unintentional overdose on clonidine.  Past Psychiatric  History: Opioid use disorder  Risk to Self:  Denies Risk to Others:  Denies Prior Inpatient Therapy:  Denies   Prior Outpatient Therapy:  Yes  Past Medical History:  Past Medical History:  Diagnosis Date  . Anxiety   . Depression   . GAD (generalized anxiety disorder)   . Hypertension   . Low back pain   . Mixed hyperlipidemia   . Panic attack    History reviewed. No pertinent surgical history. Family History:  Family History  Problem Relation Age of Onset  . Kidney disease Mother   . Cancer Mother   . Cancer Father    Family Psychiatric  History: Mother-alcohol use disorder Social History:  Social History   Substance and Sexual Activity  Alcohol Use No  . Alcohol/week: 0.0 standard drinks     Social History   Substance and Sexual Activity  Drug Use No  . Types: Hydrocodone, Oxycodone    Social History   Socioeconomic History  . Marital status: Married    Spouse name: Not on file  . Number of children: Not on file  . Years of education: Not on file  . Highest education level: Not on file  Occupational History  . Not on file  Tobacco Use  . Smoking status: Never Smoker  . Smokeless tobacco: Never Used  Substance and Sexual Activity  . Alcohol use: No    Alcohol/week: 0.0 standard drinks  . Drug use: No    Types: Hydrocodone, Oxycodone  . Sexual activity: Not on file  Other Topics Concern  . Not on file  Social History Narrative   Drinks lots of caffeine.   Social Determinants of Health   Financial  Resource Strain:   . Difficulty of Paying Living Expenses:   Food Insecurity:   . Worried About Charity fundraiser in the Last Year:   . Arboriculturist in the Last Year:   Transportation Needs:   . Film/video editor (Medical):   Marland Kitchen Lack of Transportation (Non-Medical):   Physical Activity:   . Days of Exercise per Week:   . Minutes of Exercise per Session:   Stress:   . Feeling of Stress :   Social Connections:   . Frequency of  Communication with Friends and Family:   . Frequency of Social Gatherings with Friends and Family:   . Attends Religious Services:   . Active Member of Clubs or Organizations:   . Attends Archivist Meetings:   Marland Kitchen Marital Status:    Additional Social History:    Allergies:   Allergies  Allergen Reactions  . Buprenorphine Hcl-Naloxone Hcl     Aching, vomiting, diarrhea, chills  . Subutex [Buprenorphine Hcl]     Aching, chills, vomiting    Labs:  Results for orders placed or performed during the hospital encounter of 11/26/19 (from the past 48 hour(s))  Urine rapid drug screen (hosp performed)     Status: Abnormal   Collection Time: 11/27/19  6:20 PM  Result Value Ref Range   Opiates NONE DETECTED NONE DETECTED   Cocaine NONE DETECTED NONE DETECTED   Benzodiazepines POSITIVE (A) NONE DETECTED   Amphetamines NONE DETECTED NONE DETECTED   Tetrahydrocannabinol NONE DETECTED NONE DETECTED   Barbiturates NONE DETECTED NONE DETECTED    Comment: (NOTE) DRUG SCREEN FOR MEDICAL PURPOSES ONLY.  IF CONFIRMATION IS NEEDED FOR ANY PURPOSE, NOTIFY LAB WITHIN 5 DAYS. LOWEST DETECTABLE LIMITS FOR URINE DRUG SCREEN Drug Class                     Cutoff (ng/mL) Amphetamine and metabolites    1000 Barbiturate and metabolites    200 Benzodiazepine                 381 Tricyclics and metabolites     300 Opiates and metabolites        300 Cocaine and metabolites        300 THC                            50 Performed at [facility], [place] [address] [place], [place] [address]   CBC     Status: Abnormal   Collection Time: 11/28/19  3:53 AM  Result Value Ref Range   WBC 19.7 (H) 4.0 - 10.5 K/uL   RBC 4.77 4.22 - 5.81 MIL/uL   Hemoglobin 14.2 13.0 - 17.0 g/dL   HCT 40.9 39.0 - 52.0 %   MCV 85.7 80.0 - 100.0 fL   MCH 29.8 26.0 - 34.0 pg   MCHC 34.7 30.0 - 36.0 g/dL   RDW 13.5 11.5 - 15.5 %   Platelets 282 150 - 400 K/uL   nRBC 0.0 0.0 - 0.2 %    Comment: Performed at  [facility], [place] [address] [place], [place] [address]  Basic metabolic panel     Status: Abnormal   Collection Time: 11/28/19  3:53 AM  Result Value Ref Range   Sodium 139 135 - 145 mmol/L   Potassium 3.5 3.5 - 5.1 mmol/L   Chloride 105 98 - 111 mmol/L  CO2 22 22 - 32 mmol/L   Glucose, Bld 172 (H) 70 - 99 mg/dL    Comment: Glucose reference range applies only to samples taken after fasting for at least 8 hours.   BUN 8 6 - 20 mg/dL   Creatinine, Ser 1.61 0.61 - 1.24 mg/dL   Calcium 9.1 8.9 - 09.6 mg/dL   GFR calc non Af Amer >60 >60 mL/min   GFR calc Af Amer >60 >60 mL/min   Anion gap 12 5 - 15    Comment: Performed at [facility], [address] [place], [place] [address]  C-reactive protein     Status: Abnormal   Collection Time: 11/28/19  9:08 AM  Result Value Ref Range   CRP 10.2 (H) <1.0 mg/dL    Comment: Performed at [facility], [address] [place], [place] [address]  D-dimer, quantitative (not at [facility])     Status: Abnormal   Collection Time: 11/28/19  9:08 AM  Result Value Ref Range   D-Dimer, Quant 0.52 (H) 0.00 - 0.50 ug/mL-FEU    Comment: (NOTE) At the manufacturer cut-off of 0.50 ug/mL FEU, this assay has been documented to exclude PE with a sensitivity and negative predictive value of 97 to 99%.  At this time, this assay has not been approved by the FDA to exclude DVT/VTE. Results should be correlated with clinical presentation. Performed at [facility], [address], [place], [place] [address]   Magnesium     Status: None   Collection Time: 11/28/19  9:08 AM  Result Value Ref Range   Magnesium 1.7 1.7 - 2.4 mg/dL    Comment: Performed at [facility], [address], [place], [place] [address]  Culture, blood (routine x 2)     Status: None (Preliminary result)   Collection Time: 11/28/19  1:15 PM   Specimen: BLOOD  Result Value Ref Range   Specimen Description BLOOD LEFT ANTECUBITAL    Special Requests       BOTTLES DRAWN AEROBIC AND ANAEROBIC Blood Culture adequate volume   Culture      NO GROWTH < 24 HOURS Performed at [facility], [address] [place], [place] [address]    Report Status PENDING   Procalcitonin - Baseline     Status: None   Collection Time: 11/28/19  1:24 PM  Result Value Ref Range   Procalcitonin 0.59 ng/mL    Comment:        Interpretation: PCT > 0.5 ng/mL and <= 2 ng/mL: Systemic infection (sepsis) is possible, but other conditions are known to elevate PCT as well. (NOTE)       Sepsis PCT Algorithm           Lower Respiratory Tract                                      Infection PCT Algorithm    ----------------------------     ----------------------------         PCT < 0.25 ng/mL                PCT < 0.10 ng/mL         Strongly encourage             Strongly discourage   discontinuation of antibiotics    initiation of antibiotics    ----------------------------     -----------------------------  PCT 0.25 - 0.50 ng/mL            PCT 0.10 - 0.25 ng/mL               OR       >80% decrease in PCT            Discourage initiation of                                            antibiotics      Encourage discontinuation           of antibiotics    ----------------------------     -----------------------------         PCT >= 0.50 ng/mL              PCT 0.26 - 0.50 ng/mL                AND       <80% decrease in PCT             Encourage initiation of                                             antibiotics       Encourage continuation           of antibiotics    ----------------------------     -----------------------------        PCT >= 0.50 ng/mL                  PCT > 0.50 ng/mL               AND         increase in PCT                  Strongly encourage                                      initiation of antibiotics    Strongly encourage escalation           of antibiotics                                     -----------------------------                                            PCT <= 0.25 ng/mL                                                 OR                                        > 80% decrease in PCT  Discontinue / Do not initiate                                             antibiotics Performed at [facility], [address], [place], [place] [address]   Culture, blood (routine x 2)     Status: None (Preliminary result)   Collection Time: 11/28/19  1:25 PM   Specimen: BLOOD LEFT ARM  Result Value Ref Range   Specimen Description BLOOD LEFT ARM    Special Requests      BOTTLES DRAWN AEROBIC AND ANAEROBIC Blood Culture adequate volume   Culture      NO GROWTH < 24 HOURS Performed at [facility], [address], [place], [place] [address]    Report Status PENDING   Magnesium     Status: None   Collection Time: 11/29/19  4:17 AM  Result Value Ref Range   Magnesium 1.7 1.7 - 2.4 mg/dL    Comment: Performed at [facility], [address], [place], [place] [address]  Comprehensive metabolic panel     Status: Abnormal   Collection Time: 11/29/19  4:17 AM  Result Value Ref Range   Sodium 139 135 - 145 mmol/L   Potassium 3.5 3.5 - 5.1 mmol/L   Chloride 103 98 - 111 mmol/L   CO2 26 22 - 32 mmol/L   Glucose, Bld 119 (H) 70 - 99 mg/dL    Comment: Glucose reference range applies only to samples taken after fasting for at least 8 hours.   BUN 6 6 - 20 mg/dL   Creatinine, Ser 9.48 0.61 - 1.24 mg/dL   Calcium 8.8 (L) 8.9 - 10.3 mg/dL   Total Protein 6.1 (L) 6.5 - 8.1 g/dL   Albumin 3.3 (L) 3.5 - 5.0 g/dL   AST 31 15 - 41 U/L   ALT 45 (H) 0 - 44 U/L   Alkaline Phosphatase 115 38 - 126 U/L   Total Bilirubin 1.3 (H) 0.3 - 1.2 mg/dL   GFR calc non Af Amer >60 >60 mL/min   GFR calc Af Amer >60 >60 mL/min   Anion gap 10 5 - 15    Comment: Performed at [facility], [address], [place], [place] [address]  D-dimer, quantitative (not at [facility])     Status: None    Collection Time: 11/29/19  4:17 AM  Result Value Ref Range   D-Dimer, Quant 0.40 0.00 - 0.50 ug/mL-FEU    Comment: (NOTE) At the manufacturer cut-off of 0.50 ug/mL FEU, this assay has been documented to exclude PE with a sensitivity and negative predictive value of 97 to 99%.  At this time, this assay has not been approved by the FDA to exclude DVT/VTE. Results should be correlated with clinical presentation. Performed at [facility], [address], [place], [place] [address]   C-reactive protein     Status: Abnormal   Collection Time: 11/29/19  4:17 AM  Result Value Ref Range   CRP 10.5 (H) <1.0 mg/dL    Comment: Performed at [facility], [address], [place], [place] [address]  CBC with Differential/Platelet     Status: Abnormal   Collection Time: 11/29/19  4:17 AM  Result Value Ref Range   WBC 8.5 4.0 - 10.5 K/uL   RBC 4.41 4.22 - 5.81 MIL/uL  Hemoglobin 13.1 13.0 - 17.0 g/dL   HCT 14.738.9 (L) 82.939.0 - 56.252.0 %   MCV 88.2 80.0 - 100.0 fL   MCH 29.7 26.0 - 34.0 pg   MCHC 33.7 30.0 - 36.0 g/dL   RDW 13.013.8 86.511.5 - 78.415.5 %   Platelets 237 150 - 400 K/uL   nRBC 0.0 0.0 - 0.2 %   Neutrophils Relative % 71 %   Neutro Abs 6.0 1.7 - 7.7 K/uL   Lymphocytes Relative 15 %   Lymphs Abs 1.3 0.7 - 4.0 K/uL   Monocytes Relative 10 %   Monocytes Absolute 0.8 0.1 - 1.0 K/uL   Eosinophils Relative 4 %   Eosinophils Absolute 0.3 0.0 - 0.5 K/uL   Basophils Relative 0 %   Basophils Absolute 0.0 0.0 - 0.1 K/uL   Immature Granulocytes 0 %   Abs Immature Granulocytes 0.02 0.00 - 0.07 K/uL    Comment: Performed at [facility], [address] [place], [place] [address]  Procalcitonin     Status: None   Collection Time: 11/29/19  4:17 AM  Result Value Ref Range   Procalcitonin 0.36 ng/mL    Comment:        Interpretation: PCT (Procalcitonin) <= 0.5 ng/mL: Systemic infection (sepsis) is not likely. Local bacterial infection is possible. (NOTE)       Sepsis PCT Algorithm            Lower Respiratory Tract                                      Infection PCT Algorithm    ----------------------------     ----------------------------         PCT < 0.25 ng/mL                PCT < 0.10 ng/mL         Strongly encourage             Strongly discourage   discontinuation of antibiotics    initiation of antibiotics    ----------------------------     -----------------------------       PCT 0.25 - 0.50 ng/mL            PCT 0.10 - 0.25 ng/mL               OR       >80% decrease in PCT            Discourage initiation of                                            antibiotics      Encourage discontinuation           of antibiotics    ----------------------------     -----------------------------         PCT >= 0.50 ng/mL              PCT 0.26 - 0.50 ng/mL               AND        <80% decrease in PCT             Encourage initiation of  antibiotics       Encourage continuation           of antibiotics    ----------------------------     -----------------------------        PCT >= 0.50 ng/mL                  PCT > 0.50 ng/mL               AND         increase in PCT                  Strongly encourage                                      initiation of antibiotics    Strongly encourage escalation           of antibiotics                                     -----------------------------                                           PCT <= 0.25 ng/mL                                                 OR                                        > 80% decrease in PCT                                     Discontinue / Do not initiate                                             antibiotics Performed at [facility], [address], [place], [place] [address]     Medications:  Current Facility-Administered Medications  Medication Dose Route Frequency Provider Last Rate Last Admin  . acetaminophen (TYLENOL) tablet 650 mg  650 mg Oral  Q6H PRN Hillary Bow, DO       Or  . acetaminophen (TYLENOL) suppository 650 mg  650 mg Rectal Q6H PRN Hillary Bow, DO      . amLODipine (NORVASC) tablet 10 mg  10 mg Oral Daily Tonny Bollman, MD   10 mg at 11/29/19 0937  . enoxaparin (LOVENOX) injection 40 mg  40 mg Subcutaneous Q24H Lyda Perone M, DO   40 mg at 11/29/19 6045  . ondansetron (ZOFRAN) tablet 4 mg  4 mg Oral Q6H PRN Hillary Bow, DO       Or  . ondansetron [facility]) injection 4 mg  4 mg Intravenous Q6H PRN Hillary Bow, DO        Musculoskeletal: Strength &  Muscle Tone: within normal limits Gait & Station: normal Patient leans: N/A  Psychiatric Specialty Exam: Physical Exam  Nursing note and vitals reviewed. Constitutional: He is oriented to person, place, and time. He appears well-developed.  HENT:  Head: Normocephalic.  Cardiovascular: Normal rate.  Respiratory: Effort normal.  Musculoskeletal:        General: Normal range of motion.     Cervical back: Normal range of motion.  Neurological: He is alert and oriented to person, place, and time.  Psychiatric: He has a normal mood and affect. His behavior is normal. Judgment and thought content normal.    Review of Systems  Constitutional: Negative.   HENT: Negative.   Eyes: Negative.   Respiratory: Negative.   Cardiovascular: Negative.   Gastrointestinal: Negative.   Genitourinary: Negative.   Musculoskeletal: Negative.   Skin: Negative.   Neurological: Negative.   Psychiatric/Behavioral: Negative.     Blood pressure 119/84, pulse (!) 58, temperature 97.8 F (36.6 C), temperature source Oral, resp. rate 17, height 5\' 9"  (1.753 m), weight 92.9 kg, SpO2 95 %.Body mass index is 30.26 kg/m.  General Appearance: Casual and Fairly Groomed  Eye Contact:  Good  Speech:  Clear and Coherent and Normal Rate  Volume:  Normal  Mood:  Euthymic  Affect:  Appropriate and Congruent  Thought Process:  Coherent, Goal Directed and Descriptions of  Associations: Intact  Orientation:  Full (Time, Place, and Person)  Thought Content:  WDL and Logical  Suicidal Thoughts:  No  Homicidal Thoughts:  No  Memory:  Immediate;   Good Recent;   Good Remote;   Good  Judgement:  Good  Insight:  Good  Psychomotor Activity:  Normal  Concentration:  Concentration: Good and Attention Span: Good  Recall:  Good  Fund of Knowledge:  Good  Language:  Good  Akathisia:  No  Handed:  Right  AIMS (if indicated):     Assets:  Communication Skills Desire for Improvement Financial Resources/Insurance Housing Intimacy Leisure Time Physical Health Resilience Social Support Talents/Skills  ADL's:  Intact  Cognition:  WNL  Sleep:        Treatment Plan Summary: Case discussed with Dr. . Medication management recommend consider Paxil 20 mg daily to address mood.  Disposition: No evidence of imminent risk to self or others at present.   Patient does not meet criteria for psychiatric inpatient admission. Supportive therapy provided about ongoing stressors.  This service was provided via telemedicine using a 2-way, interactive audio and video technology.  Names of all persons participating in this telemedicine service and their role in this encounter. Name: Doil Kamara Role: Patient  Name: Erroll Luna telephone Role: Patient's wife  Name: Kennon Holter Role: FNP    Berneice Heinrich, FNP 11/29/2019 12:41 PM

## 2019-11-29 NOTE — Progress Notes (Signed)
Attempted to call psy number [phone] for consult and no answer. 

## 2019-11-29 NOTE — Discharge Summary (Signed)
Noah Holland, is a 49 y.o. male  DOB 21-Dec-1970  MRN [id number].  Admission date:  11/26/2019  Admitting Physician  Etta Quill, DO  Discharge Date:  11/29/2019   Primary MD  Cox, Fredric Dine, NP  Recommendations for primary care physician for things to follow:  - please check BC, CMP during next visit.   Admission Diagnosis  Bradycardia [R00.1] Clonidine overdose, undetermined intent, initial encounter [T46.5X4A]   Discharge Diagnosis  Bradycardia [R00.1] Clonidine overdose, undetermined intent, initial encounter [T46.5X4A]    Principal Problem:   OD (overdose of drug), accidental or unintentional, initial encounter Active Problems:   Opiate abuse, episodic (HCC)   Bradycardia      Past Medical History:  Diagnosis Date  . Anxiety   . Depression   . GAD (generalized anxiety disorder)   . Hypertension   . Low back pain   . Mixed hyperlipidemia   . Panic attack     History reviewed. No pertinent surgical history.     History of present illness and  Hospital Course:     Kindly see H&P for history of present illness and admission details, please review complete Labs, Consult reports and Test reports for all details in brief  HPI  from the history and physical done on the day of admission 11/26/2019  HPI: Noah Holland is a 49 y.o. male with medical history significant of HTN, HLD, opiate abuse.  Pt took about 20 tabs of 0.1mg  clonidine over past 24h, wasn't suicide attempt, just liked the way it made him feel.  Last took 5 tabs 3-4h PTA.  Took 1mg  Klonopin yesterday AM  Took 2 tramadol day before.  No other EtOH or illicit drug use per patient.  Hospital Course   Noah A Brownis a 49 y.o.malewith medical history significant ofHTN, HLD, opiate abuse. Pt took about 20 tabs of 0.1mg  clonidine over past 24h, per patient as BP was running high, patient denies  any suicidal thoughts or ideations, but when he took 20 tablets of clonidine, psychiatry has been consulted to ensure there is no underlying suicidal intent as he was having some domestic problems, patient was seen by psych, there was no indication for inpatient admission, there was no suicidal intent, so patient will be discharged.   Covid 19 infection  -  No hypoxia, chest x-ray clear. No indication for steroids or remdesivir, CRP has been elevated, but remained stable, patient currently denies any symptoms, so no indication for treatment, I have discussed with the patient, have informed him if he does develop any dyspnea to come back to ED for evaluation.  Clonidine overdose.   -This appears to be to poor understanding of his medications, there does not appear to be any suicide attempt, psychiatric input greatly appreciated , cardiology input greatly appreciated, his Coumadin has been stopped, he has been transitioned to amlodipine instead . -No further bradycardia, heart rate has been stable at the 60s range, no heart block . -Psych input greatly appreciated, no evidence of suicide  attempt, but he does have some mood disorder and he was started on Paxil .  History of narcotic abuse.  Counseled to quit.  Leukocytosis. Stress related, resolved.  Hypertension -Changed clonidine to amlodipine.  Discharge Condition:  -Stable, discharge plan has been discussed with his wife as well per his request  Follow UP  Follow-up Information    Cox, Alcario Drought A, NP Follow up in 2 week(s).   Specialty: Family Medicine Why: please inform them of your COVID 19 positive status Contact information: [address] [place] [address] [phone]             Discharge Instructions  and  Discharge Medications    Discharge Instructions    Diet - low sodium heart healthy   Complete by: As directed    Discharge instructions   Complete by: As directed    Follow with Primary MD Cox, Noah Foster,  NP in 14 days   Get CBC, CMP,  checked  by Primary MD next visit.   Activity: As tolerated with Full fall precautions use walker/cane & assistance as needed   Disposition Home    Diet: Heart Healthy , with feeding assistance and aspiration precautions.  On your next visit with your primary care physician please Get Medicines reviewed and adjusted.   Please request your Prim.MD to go over all Hospital Tests and Procedure/Radiological results at the follow up, please get all Hospital records sent to your Prim MD by signing hospital release before you go home.   If you experience worsening of your admission symptoms, develop shortness of breath, life threatening emergency, suicidal or homicidal thoughts you must seek medical attention immediately by calling 911 or calling your MD immediately  if symptoms less severe.  You Must read complete instructions/literature along with all the possible adverse reactions/side effects for all the Medicines you take and that have been prescribed to you. Take any new Medicines after you have completely understood and accpet all the possible adverse reactions/side effects.   Do not drive, operating heavy machinery, perform activities at heights, swimming or participation in water activities or provide baby sitting services if your were admitted for syncope or siezures until you have seen by Primary MD or a Neurologist and advised to do so again.  Do not drive when taking Pain medications.    Do not take more than prescribed Pain, Sleep and Anxiety Medications  Special Instructions: If you have smoked or chewed Tobacco  in the last 2 yrs please stop smoking, stop any regular Alcohol  and or any Recreational drug use.  Wear Seat belts while driving.   Please note  You were cared for by a hospitalist during your hospital stay. If you have any questions about your discharge medications or the care you received while you were in the hospital after you  are discharged, you can call the unit and asked to speak with the hospitalist on call if the hospitalist that took care of you is not available. Once you are discharged, your primary care physician will handle any further medical issues. Please note that NO REFILLS for any discharge medications will be authorized once you are discharged, as it is imperative that you return to your primary care physician (or establish a relationship with a primary care physician if you do not have one) for your aftercare needs so that they can reassess your need for medications and monitor your lab values.   Increase activity slowly   Complete by: As  directed      Allergies as of 11/29/2019      Reactions   Buprenorphine Hcl-naloxone Hcl    Aching, vomiting, diarrhea, chills   Subutex [buprenorphine Hcl]    Aching, chills, vomiting      Medication List    STOP taking these medications   cloNIDine 0.1 MG tablet Commonly known as: CATAPRES     TAKE these medications   amLODipine 10 MG tablet Commonly known as: NORVASC Take 1 tablet (10 mg total) by mouth daily. Start taking on: November 30, 2019   clonazePAM 1 MG tablet Commonly known as: KLONOPIN Take 1 mg by mouth 2 (two) times daily as needed for anxiety.   PARoxetine 20 MG tablet Commonly known as: PAXIL Take 1 tablet (20 mg total) by mouth daily.         Diet and Activity recommendation: See Discharge Instructions above   Consults obtained -  Cardiology Psychiatry   Major procedures and Radiology Reports - PLEASE review detailed and final reports for all details, in brief -      DG Chest Port 1 View  Result Date: 11/26/2019 CLINICAL DATA:  Overdose EXAM: PORTABLE CHEST 1 VIEW COMPARISON:  06/27/2014 FINDINGS: Cardiomegaly. No confluent opacities, effusions or edema. No acute bony abnormality. IMPRESSION: Cardiomegaly.  No active disease. Electronically Signed   By: Charlett Nose M.D.   On: 11/26/2019 23:07    Micro Results      Recent Results (from the past 240 hour(s))  SARS CORONAVIRUS 2 (TAT 6-24 HRS) Nasopharyngeal Nasopharyngeal Swab     Status: Abnormal   Collection Time: 11/27/19  4:15 AM   Specimen: Nasopharyngeal Swab  Result Value Ref Range Status   SARS Coronavirus 2 POSITIVE (A) NEGATIVE Final    Comment: RESULT CALLED TO, READ BACK BY AND VERIFIED WITH: Lelon Huh RN 12:00 11/27/19 ([email or web address]) (NOTE) SARS-CoV-2 target nucleic acids are DETECTED. The SARS-CoV-2 RNA is generally detectable in upper and lower respiratory specimens during the acute phase of infection. Positive results are indicative of the presence of SARS-CoV-2 RNA. Clinical correlation with patient history and other diagnostic information is  necessary to determine patient infection status. Positive results do not rule out bacterial infection or co-infection with other viruses.  The expected result is Negative. Fact Sheet for Patients: HairSlick.no Fact Sheet for Healthcare Providers: quierodirigir.com This test is not yet approved or cleared by the [place] FDA and  has been authorized for detection and/or diagnosis of SARS-CoV-2 by FDA under an Emergency Use Authorization (EUA). This EUA will remain  in effect (meaning this test can be used) for the  duration of the COVID-19 declaration under Section 564(b)(1) of the Act, 21 U.S.C. section 360bbb-3(b)(1), unless the authorization is terminated or revoked sooner. Performed at [facility], [address], [place], [place] [address]   Culture, blood (routine x 2)     Status: None (Preliminary result)   Collection Time: 11/28/19  1:15 PM   Specimen: BLOOD  Result Value Ref Range Status   Specimen Description BLOOD LEFT ANTECUBITAL  Final   Special Requests   Final    BOTTLES DRAWN AEROBIC AND ANAEROBIC Blood Culture adequate volume   Culture   Final    NO GROWTH < 24 HOURS Performed at [facility], [address], [place], [place] [address]    Report Status PENDING  Incomplete  Culture, blood (routine x 2)     Status: None (Preliminary result)   Collection Time:  11/28/19  1:25 PM   Specimen: BLOOD LEFT ARM  Result Value Ref Range Status   Specimen Description BLOOD LEFT ARM  Final   Special Requests   Final    BOTTLES DRAWN AEROBIC AND ANAEROBIC Blood Culture adequate volume   Culture   Final    NO GROWTH < 24 HOURS Performed at [facility], [address], [place], [place] [address]    Report Status PENDING  Incomplete       Today   Subjective:   Goldie Dimmer today has no headache,no chest abdominal pain,no new weakness tingling or numbness, feels much better wants to go home today.   Objective:   Blood pressure 119/84, pulse (!) 58, temperature 97.8 F (36.6 C), temperature source Oral, resp. rate 17, height 5\' 9"  (1.753 m), weight 92.9 kg, SpO2 95 %.   Intake/Output Summary (Last 24 hours) at 11/29/2019 1350 Last data filed at 11/29/2019 1019 Gross per 24 hour  Intake 840 ml  Output 725 ml  Net 115 ml    Exam Awake Alert, Oriented x 3, No new F.N deficits, Normal affect Symmetrical Chest wall movement, Good air movement bilaterally, CTAB RRR,No Gallops,Rubs or new Murmurs, No Parasternal Heave +ve B.Sounds, Abd Soft, Non tender, No organomegaly appriciated, No rebound -guarding or rigidity. No Cyanosis, Clubbing or edema, No new Rash or bruise  Data Review   CBC w Diff:  Lab Results  Component Value Date   WBC 8.5 11/29/2019   HGB 13.1 11/29/2019   HCT 38.9 (L) 11/29/2019   PLT 237 11/29/2019   LYMPHOPCT 15 11/29/2019   MONOPCT 10 11/29/2019   EOSPCT 4 11/29/2019   BASOPCT 0 11/29/2019    CMP:  Lab Results  Component Value Date   NA 139 11/29/2019   K 3.5 11/29/2019   CL 103 11/29/2019   CO2 26 11/29/2019   BUN 6 11/29/2019   CREATININE 0.96 11/29/2019   PROT 6.1 (L) 11/29/2019   ALBUMIN 3.3 (L) 11/29/2019   BILITOT 1.3 (H) 11/29/2019    ALKPHOS 115 11/29/2019   AST 31 11/29/2019   ALT 45 (H) 11/29/2019  .   Total Time in preparing paper work, data evaluation and todays exam - 35 minutes  01/29/2020 M.D on 11/29/2019 at 1:50 PM  [facility]   Office  [phone]

## 2019-11-29 NOTE — Care Management (Addendum)
Pt deemed stable for discharge today.  CM spoke with pt via phone -pt confirms he is independent from home.  Pt informed CM that he has [employer] however does not have prescription insurance.  Pt will contact admissions to provide medical insurance information once discharged.  TOC will deliver pt's meds at discharge - pt confirms he can pay cost of $14 for discharge meds (cost calculated by [facility] pharmacy).   Pt informed CM that he is no longer active with PCP on AVS.  Pt declined for CM to set up post discharge follow up appt with [facility] - pt is in agreement for CM to place clinic information on AVS. CM strongly encouraged pt to contact clinic ASAP as he will need post discharge follow up appt and he will also need medical management for ongoing condition/new medications.  Pt acknowledged CM's request.   Pt in agreement for CM to discuss discharge plan with his wife Terri.  CM again reiterated the importance of pt/wife making PCP followup appt.   No other CM needs determined -CM signing off

## 2019-11-29 NOTE — Discharge Instructions (Signed)
Person Under Monitoring Name: Noah Holland  Location: [address] [place] [address]   Infection Prevention Recommendations for Individuals Confirmed to have, or Being Evaluated for, 2019 Novel Coronavirus (COVID-19) Infection Who Receive Care at Home  Individuals who are confirmed to have, or are being evaluated for, COVID-19 should follow the prevention steps below until a healthcare provider or local or state health department says they can return to normal activities.  Stay home except to get medical care You should restrict activities outside your home, except for getting medical care. Do not go to work, school, or public areas, and do not use public transportation or taxis.  Call ahead before visiting your doctor Before your medical appointment, call the healthcare provider and tell them that you have, or are being evaluated for, COVID-19 infection. This will help the healthcare provider's office take steps to keep other people from getting infected. Ask your healthcare provider to call the local or state health department.  Monitor your symptoms Seek prompt medical attention if your illness is worsening (e.g., difficulty breathing). Before going to your medical appointment, call the healthcare provider and tell them that you have, or are being evaluated for, COVID-19 infection. Ask your healthcare provider to call the local or state health department.  Wear a facemask You should wear a facemask that covers your nose and mouth when you are in the same room with other people and when you visit a healthcare provider. People who live with or visit you should also wear a facemask while they are in the same room with you.  Separate yourself from other people in your home As much as possible, you should stay in a different room from other people in your home. Also, you should use a separate bathroom, if available.  Avoid sharing household items You should not share  dishes, drinking glasses, cups, eating utensils, towels, bedding, or other items with other people in your home. After using these items, you should wash them thoroughly with soap and water.  Cover your coughs and sneezes Cover your mouth and nose with a tissue when you cough or sneeze, or you can cough or sneeze into your sleeve. Throw used tissues in a lined trash can, and immediately wash your hands with soap and water for at least 20 seconds or use an alcohol-based hand rub.  Wash your [employer] your hands often and thoroughly with soap and water for at least 20 seconds. You can use an alcohol-based hand sanitizer if soap and water are not available and if your hands are not visibly dirty. Avoid touching your eyes, nose, and mouth with unwashed hands.   Prevention Steps for Caregivers and Household Members of Individuals Confirmed to have, or Being Evaluated for, COVID-19 Infection Being Cared for in the Home  If you live with, or provide care at home for, a person confirmed to have, or being evaluated for, COVID-19 infection please follow these guidelines to prevent infection:  Follow healthcare provider's instructions Make sure that you understand and can help the patient follow any healthcare provider instructions for all care.  Provide for the patient's basic needs You should help the patient with basic needs in the home and provide support for getting groceries, prescriptions, and other personal needs.  Monitor the patient's symptoms If they are getting sicker, call his or her medical provider and tell them that the patient has, or is being evaluated for, COVID-19 infection. This will help the healthcare provider's office  take steps to keep other people from getting infected. Ask the healthcare provider to call the local or state health department.  Limit the number of people who have contact with the patient  If possible, have only one caregiver for the patient.  Other  household members should stay in another home or place of residence. If this is not possible, they should stay  in another room, or be separated from the patient as much as possible. Use a separate bathroom, if available.  Restrict visitors who do not have an essential need to be in the home.  Keep older adults, very young children, and other sick people away from the patient Keep older adults, very young children, and those who have compromised immune systems or chronic health conditions away from the patient. This includes people with chronic heart, lung, or kidney conditions, diabetes, and cancer.  Ensure good ventilation Make sure that shared spaces in the home have good air flow, such as from an air conditioner or an opened window, weather permitting.  Wash your hands often  Wash your hands often and thoroughly with soap and water for at least 20 seconds. You can use an alcohol based hand sanitizer if soap and water are not available and if your hands are not visibly dirty.  Avoid touching your eyes, nose, and mouth with unwashed hands.  Use disposable paper towels to dry your hands. If not available, use dedicated cloth towels and replace them when they become wet.  Wear a facemask and gloves  Wear a disposable facemask at all times in the room and gloves when you touch or have contact with the patient's blood, body fluids, and/or secretions or excretions, such as sweat, saliva, sputum, nasal mucus, vomit, urine, or feces.  Ensure the mask fits over your nose and mouth tightly, and do not touch it during use.  Throw out disposable facemasks and gloves after using them. Do not reuse.  Wash your hands immediately after removing your facemask and gloves.  If your personal clothing becomes contaminated, carefully remove clothing and launder. Wash your hands after handling contaminated clothing.  Place all used disposable facemasks, gloves, and other waste in a lined container before  disposing them with other household waste.  Remove gloves and wash your hands immediately after handling these items.  Do not share dishes, glasses, or other household items with the patient  Avoid sharing household items. You should not share dishes, drinking glasses, cups, eating utensils, towels, bedding, or other items with a patient who is confirmed to have, or being evaluated for, COVID-19 infection.  After the person uses these items, you should wash them thoroughly with soap and water.  Wash laundry thoroughly  Immediately remove and wash clothes or bedding that have blood, body fluids, and/or secretions or excretions, such as sweat, saliva, sputum, nasal mucus, vomit, urine, or feces, on them.  Wear gloves when handling laundry from the patient.  Read and follow directions on labels of laundry or clothing items and detergent. In general, wash and dry with the warmest temperatures recommended on the label.  Clean all areas the individual has used often  Clean all touchable surfaces, such as counters, tabletops, doorknobs, bathroom fixtures, toilets, phones, keyboards, tablets, and bedside tables, every day. Also, clean any surfaces that may have blood, body fluids, and/or secretions or excretions on them.  Wear gloves when cleaning surfaces the patient has come in contact with.  Use a diluted bleach solution (e.g., dilute bleach with 1 part  bleach and 10 parts water) or a household disinfectant with a label that says EPA-registered for coronaviruses. To make a bleach solution at home, add 1 tablespoon of bleach to 1 quart (4 cups) of water. For a larger supply, add  cup of bleach to 1 gallon (16 cups) of water.  Read labels of cleaning products and follow recommendations provided on product labels. Labels contain instructions for safe and effective use of the cleaning product including precautions you should take when applying the product, such as wearing gloves or eye protection  and making sure you have good ventilation during use of the product.  Remove gloves and wash hands immediately after cleaning.  Monitor yourself for signs and symptoms of illness Caregivers and household members are considered close contacts, should monitor their health, and will be asked to limit movement outside of the home to the extent possible. Follow the monitoring steps for close contacts listed on the symptom monitoring form.   ? If you have additional questions, contact your local health department or call the epidemiologist on call at [phone] (available 24/7). ? This guidance is subject to change. For the most up-to-date guidance from [facility], please refer to their website: YouBlogs.pl

## 2019-12-03 LAB — CULTURE, BLOOD (ROUTINE X 2)
Culture: NO GROWTH
Culture: NO GROWTH
Special Requests: ADEQUATE
Special Requests: ADEQUATE

## 2020-12-11 IMAGING — DX DG CHEST 1V PORT
1 series · 1 of 1 positions shown · non-contrast
Comparison: 06/27/2014

CLINICAL DATA: Overdose

EXAM:
PORTABLE CHEST 1 VIEW

[chest ap]
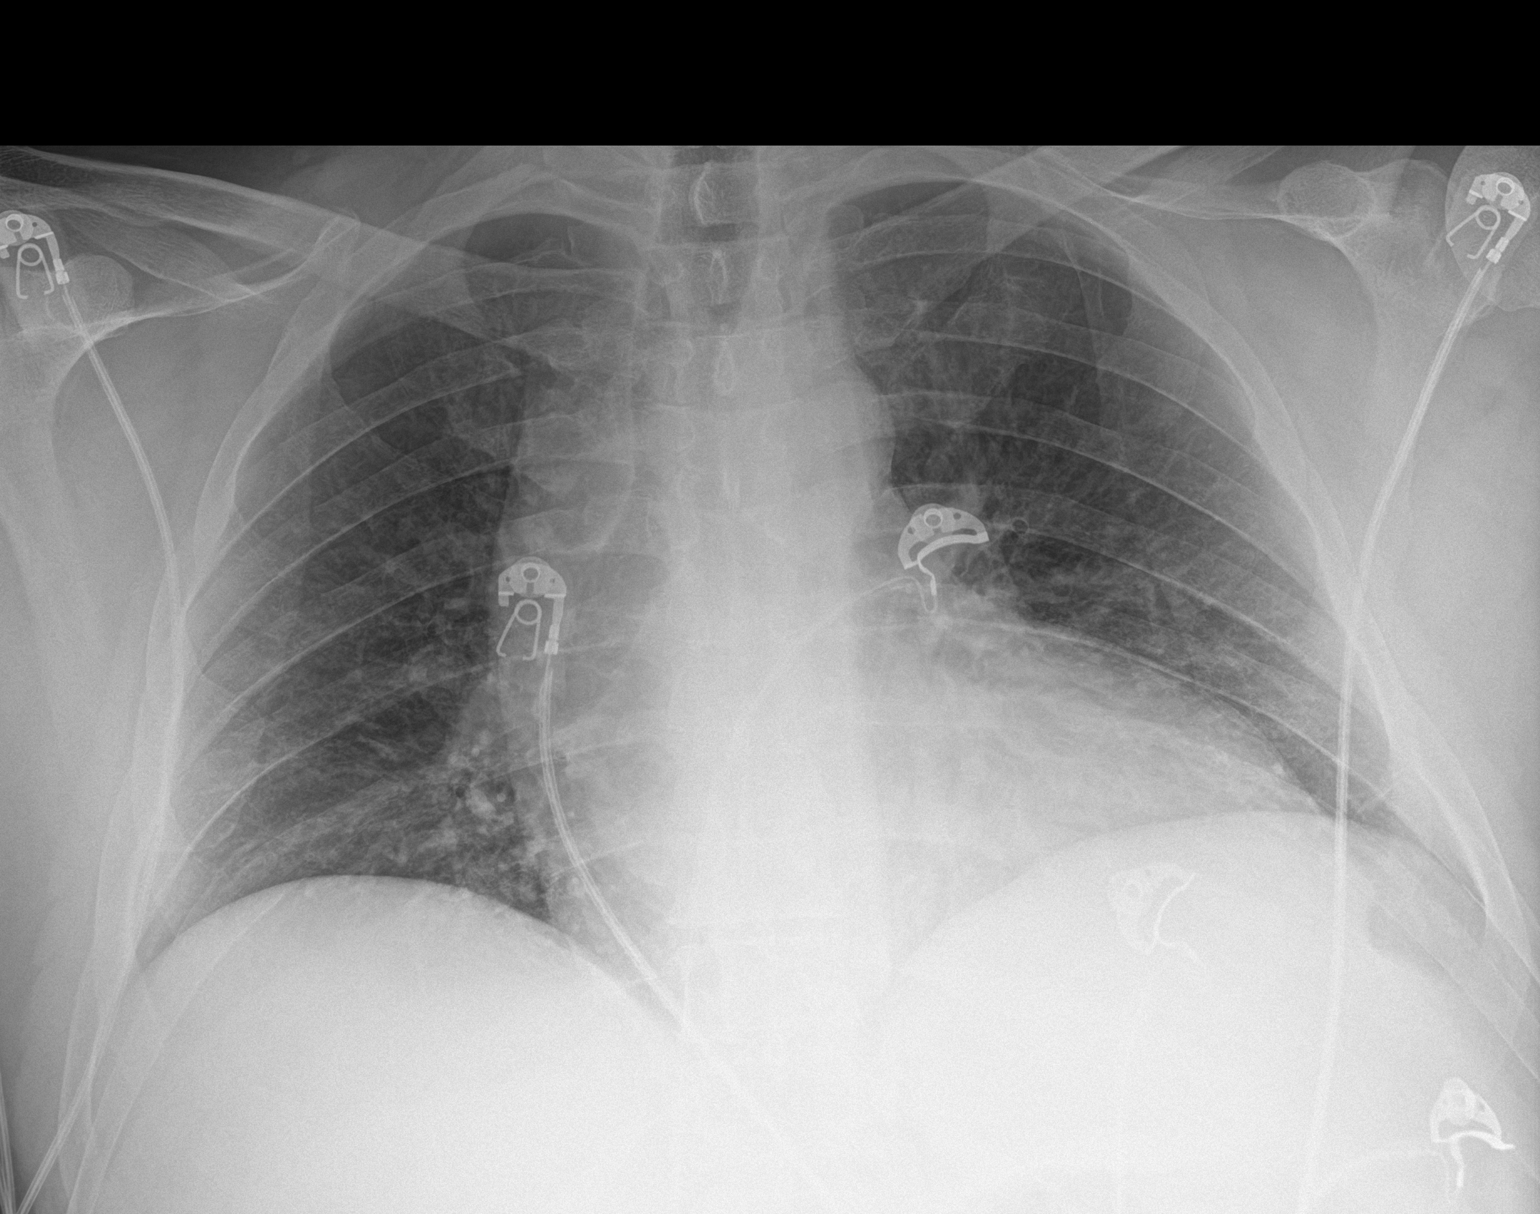

[1 of 1 positions shown; findings below may reference images not displayed]

FINDINGS: Cardiomegaly. No confluent opacities, effusions or edema. No acute
bony abnormality.
IMPRESSION: Cardiomegaly.  No active disease.
